# Patient Record
Sex: Male | Born: 2000 | Race: Black or African American | Hispanic: No | Marital: Single | Smoking: Former smoker
Health system: Southern US, Community
[De-identification: ages and names within clinical notes are randomized; demographics above are authoritative.]

## PROBLEM LIST (undated history)

## (undated) DIAGNOSIS — J309 Allergic rhinitis, unspecified: Secondary | ICD-10-CM

## (undated) DIAGNOSIS — R51 Headache: Secondary | ICD-10-CM

## (undated) DIAGNOSIS — J4540 Moderate persistent asthma, uncomplicated: Secondary | ICD-10-CM

## (undated) DIAGNOSIS — J45909 Unspecified asthma, uncomplicated: Secondary | ICD-10-CM

## (undated) DIAGNOSIS — R519 Headache, unspecified: Secondary | ICD-10-CM

## (undated) HISTORY — DX: Headache: R51

## (undated) HISTORY — DX: Allergic rhinitis, unspecified: J30.9

## (undated) HISTORY — DX: Headache, unspecified: R51.9

## (undated) HISTORY — DX: Unspecified asthma, uncomplicated: J45.909

## (undated) HISTORY — PX: OTHER SURGICAL HISTORY: SHX169

## (undated) HISTORY — DX: Moderate persistent asthma, uncomplicated: J45.40

---
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
## 2022-03-15 ENCOUNTER — Other Ambulatory Visit: Payer: Self-pay | Admitting: *Deleted

## 2022-03-15 MED ORDER — ALBUTEROL SULFATE HFA 108 (90 BASE) MCG/ACT IN AERS
1.0000 | INHALATION_SPRAY | Freq: Four times a day (QID) | RESPIRATORY_TRACT | 11 refills | Status: DC | PRN
Start: 2022-03-15 — End: 2022-07-04

## 2022-03-18 ENCOUNTER — Other Ambulatory Visit: Payer: Self-pay | Admitting: *Deleted

## 2022-03-18 MED ORDER — VENTOLIN HFA 108 (90 BASE) MCG/ACT IN AERS
1.0000 | INHALATION_SPRAY | Freq: Four times a day (QID) | RESPIRATORY_TRACT | 3 refills | Status: DC | PRN
Start: 2022-03-18 — End: 2022-07-04

## 2022-04-12 ENCOUNTER — Telehealth: Payer: Self-pay | Admitting: *Deleted

## 2022-04-12 NOTE — Telephone Encounter (Signed)
PA for Albuterol Sulfate HFA 108 (90 Base)MCG/ACT aerosol    Approved, Coverage Starts on: 04/11/2022 12:00:00 AM, Coverage Ends on: 04/11/2023 12:00:00 AM.   Pharmacy aware.

## 2022-06-12 ENCOUNTER — Telehealth: Payer: Medicaid Other | Admitting: Nurse Practitioner

## 2022-06-12 ENCOUNTER — Encounter: Payer: Self-pay | Admitting: Nurse Practitioner

## 2022-06-12 DIAGNOSIS — Z91199 Patient's noncompliance with other medical treatment and regimen due to unspecified reason: Secondary | ICD-10-CM

## 2022-06-12 NOTE — Progress Notes (Signed)
I called patient and he says he was already seen at the urgent care earlier and will not need this visit.

## 2022-06-20 ENCOUNTER — Ambulatory Visit: Payer: Medicaid Other | Admitting: Allergy

## 2022-07-01 ENCOUNTER — Ambulatory Visit: Payer: Medicaid Other | Admitting: Internal Medicine

## 2022-07-03 ENCOUNTER — Ambulatory Visit: Payer: Medicaid Other | Admitting: Internal Medicine

## 2022-07-03 NOTE — Progress Notes (Unsigned)
New Patient Note  RE: James Pittman MRN: [id number] DOB: 01-13-2001 Date of Office Visit: 07/04/2022  Consult requested by: Rogelia Mire, PA Primary care provider: Loman Brooklyn, FNP  Chief Complaint: No chief complaint on file.  History of Present Illness: I had the pleasure of seeing James Pittman for initial evaluation at the [facility] [place] [facility] [place] on 07/03/2022. He is a 22 y.o. male, who is referred here by Loman Brooklyn, FNP for the evaluation of asthma.  He reports symptoms of *** chest tightness, shortness of breath, coughing, wheezing, nocturnal awakenings for *** years. Current medications include *** which help. He reports *** using aerochamber with inhalers. He tried the following inhalers: ***. Main triggers are ***allergies, infections, weather changes, smoke, exercise, pet exposure. In the last month, frequency of symptoms: ***x/week. Frequency of nocturnal symptoms: ***x/month. Frequency of SABA use: ***x/week. Interference with physical activity: ***. Sleep is ***disturbed. In the last 12 months, emergency room visits/urgent care visits/doctor office visits or hospitalizations due to respiratory issues: ***. In the last 12 months, oral steroids courses: ***. Lifetime history of hospitalization for respiratory issues: ***. Prior intubations: ***. Asthma was diagnosed at age *** by ***. History of pneumonia: ***. He was evaluated by allergist ***pulmonologist in the past. Smoking exposure: ***. Up to date with flu vaccine: ***. Up to date with pneumonia vaccine: ***. Up to date with COVID-19 vaccine: ***. Prior Covid-19 infection: ***. History of reflux: ***.  Assessment and Plan: James Pittman is a 22 y.o. male with: No problem-specific Assessment & Plan notes found for this encounter.  No follow-ups on file.  No orders of the defined types were placed in this encounter.  Lab Orders  No laboratory test(s) ordered today    Other allergy screening: Asthma: {Blank  single:19197::"yes","no"} Rhino conjunctivitis: {Blank single:19197::"yes","no"} Food allergy: {Blank single:19197::"yes","no"} Medication allergy: {Blank single:19197::"yes","no"} Hymenoptera allergy: {Blank single:19197::"yes","no"} Urticaria: {Blank single:19197::"yes","no"} Eczema:{Blank single:19197::"yes","no"} History of recurrent infections suggestive of immunodeficency: {Blank single:19197::"yes","no"}  Diagnostics: Spirometry:  Tracings reviewed. His effort: {Blank single:19197::"Good reproducible efforts.","It was hard to get consistent efforts and there is a question as to whether this reflects a maximal maneuver.","Poor effort, data can not be interpreted."} FVC: ***L FEV1: ***L, ***% predicted FEV1/FVC ratio: ***% Interpretation: {Blank single:19197::"Spirometry consistent with mild obstructive disease","Spirometry consistent with moderate obstructive disease","Spirometry consistent with severe obstructive disease","Spirometry consistent with possible restrictive disease","Spirometry consistent with mixed obstructive and restrictive disease","Spirometry uninterpretable due to technique","Spirometry consistent with normal pattern","No overt abnormalities noted given today's efforts"}.  Please see scanned spirometry results for details.  Skin Testing: {Blank single:19197::"Select foods","Environmental allergy panel","Environmental allergy panel and select foods","Food allergy panel","None","Deferred due to recent antihistamines use"}. *** Results discussed with patient/family.   Past Medical History: Patient Active Problem List   Diagnosis Date Noted  . Upper respiratory infection with cough and congestion 07/17/2021  . Other headache syndrome 01/08/2021  . Asthma 03/14/2015   Past Medical History:  Diagnosis Date  . Asthma   . Headache    Past Surgical History: Past Surgical History:  Procedure Laterality Date  . broke finger Left    index   Medication List:   Current Outpatient Medications  Medication Sig Dispense Refill  . albuterol (VENTOLIN HFA) 108 (90 Base) MCG/ACT inhaler Inhale 1-2 puffs into the lungs every 6 (six) hours as needed for wheezing or shortness of breath. 8 g 11  . amoxicillin-clavulanate (AUGMENTIN) 875-125 MG tablet Take 1 tablet by mouth 2 (two) times daily. 20 tablet 0  .  fluticasone (FLOVENT HFA) 44 MCG/ACT inhaler Inhale 2 puffs into the lungs in the morning and at bedtime. (Patient not taking: Reported on 12/25/2021) 3 each 3  . montelukast (SINGULAIR) 10 MG tablet Take 10 mg by mouth daily. (Patient not taking: Reported on 12/25/2021)    . predniSONE (DELTASONE) 20 MG tablet 2 tabs po daily x 4 days 8 tablet 0  . VENTOLIN HFA 108 (90 Base) MCG/ACT inhaler Inhale 1-2 puffs into the lungs every 6 (six) hours as needed for wheezing or shortness of breath. 8 g 3   No current facility-administered medications for this visit.   Allergies: No Known Allergies Social History: Social History   Socioeconomic History  . Marital status: Single    Spouse name: Not on file  . Number of children: Not on file  . Years of education: Not on file  . Highest education level: Not on file  Occupational History  . Not on file  Tobacco Use  . Smoking status: Never  . Smokeless tobacco: Never  Vaping Use  . Vaping Use: Some days  Substance and Sexual Activity  . Alcohol use: No  . Drug use: No  . Sexual activity: Not on file  Other Topics Concern  . Not on file  Social History Narrative  . Not on file   Social Determinants of Health   Financial Resource Strain: Not on file  Food Insecurity: Not on file  Transportation Needs: Not on file  Physical Activity: Not on file  Stress: Not on file  Social Connections: Not on file   Lives in a ***. Smoking: *** Occupation: ***  Environmental HistoryFreight forwarder in the house: Estate agent in the family room: {Blank  single:19197::"yes","no"} Carpet in the bedroom: {Blank single:19197::"yes","no"} Heating: {Blank single:19197::"electric","gas","heat pump"} Cooling: {Blank single:19197::"central","window","heat pump"} Pet: {Blank single:19197::"yes ***","no"}  Family History: Family History  Problem Relation Age of Onset  . Hypertension Mother    Problem                               Relation Asthma                                   *** Eczema                                *** Food allergy                          *** Allergic rhino conjunctivitis     ***  Review of Systems  Constitutional:  Negative for appetite change, chills, fever and unexpected weight change.  HENT:  Negative for congestion and rhinorrhea.   Eyes:  Negative for itching.  Respiratory:  Negative for cough, chest tightness, shortness of breath and wheezing.   Cardiovascular:  Negative for chest pain.  Gastrointestinal:  Negative for abdominal pain.  Genitourinary:  Negative for difficulty urinating.  Skin:  Negative for rash.  Neurological:  Negative for headaches.   Objective: There were no vitals taken for this visit. There is no height or weight on file to calculate BMI. Physical Exam Vitals and nursing note reviewed.  Constitutional:      Appearance: Normal appearance. He is well-developed.  HENT:     Head: Normocephalic and atraumatic.  Right Ear: Tympanic membrane and external ear normal.     Left Ear: Tympanic membrane and external ear normal.     Nose: Nose normal.     Mouth/Throat:     Mouth: Mucous membranes are moist.     Pharynx: Oropharynx is clear.  Eyes:     Conjunctiva/sclera: Conjunctivae normal.  Cardiovascular:     Rate and Rhythm: Normal rate and regular rhythm.     Heart sounds: Normal heart sounds. No murmur heard.    No friction rub. No gallop.  Pulmonary:     Effort: Pulmonary effort is normal.     Breath sounds: Normal breath sounds. No wheezing, rhonchi or rales.  Musculoskeletal:      Cervical back: Neck supple.  Skin:    General: Skin is warm.     Findings: No rash.  Neurological:     Mental Status: He is alert and oriented to person, place, and time.  Psychiatric:        Behavior: Behavior normal.  The plan was reviewed with the patient/family, and all questions/concerned were addressed.  It was my pleasure to see James Pittman today and participate in his care. Please feel free to contact me with any questions or concerns.  Sincerely,  Wyline Mood, DO Allergy & Immunology  [facility] office: [phone] [facility] office: [phone]

## 2022-07-04 ENCOUNTER — Other Ambulatory Visit: Payer: Self-pay

## 2022-07-04 ENCOUNTER — Ambulatory Visit (INDEPENDENT_AMBULATORY_CARE_PROVIDER_SITE_OTHER): Payer: Medicaid Other | Admitting: Allergy

## 2022-07-04 ENCOUNTER — Encounter: Payer: Self-pay | Admitting: Allergy

## 2022-07-04 VITALS — BP 114/76 | HR 80 | Temp 98.3°F | Resp 16 | Ht 63.03 in | Wt 134.4 lb

## 2022-07-04 DIAGNOSIS — J4541 Moderate persistent asthma with (acute) exacerbation: Secondary | ICD-10-CM

## 2022-07-04 DIAGNOSIS — J3089 Other allergic rhinitis: Secondary | ICD-10-CM

## 2022-07-04 MED ORDER — PREDNISONE 20 MG PO TABS: 20.0000 mg | ORAL_TABLET | Freq: Every day | ORAL | 0 refills | Status: DC

## 2022-07-04 MED ORDER — BUDESONIDE-FORMOTEROL FUMARATE 160-4.5 MCG/ACT IN AERO: 2.0000 | INHALATION_SPRAY | Freq: Two times a day (BID) | RESPIRATORY_TRACT | 3 refills | Status: DC

## 2022-07-04 NOTE — Assessment & Plan Note (Signed)
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  No skin testing today due poor asthma control status. Will test at next visit if asthma is better controlled otherwise will get bloodwork.

## 2022-07-04 NOTE — Assessment & Plan Note (Signed)
>>  ASSESSMENT AND PLAN FOR ASTHMA, NOT WELL CONTROLLED WRITTEN ON 07/04/2022 10:35 AM BY Garnet Sierras, DO  Diagnosed with asthma over 10 years ago and noted worsening symptom the past 5 years. Currently only using albuterol on a daily basis. He is not sure if he had a daily maintenance inhaler in the past. No triggers noted. Covid-19 in 2020. Trying to quit vaping. 2023 CXR unremarkable.  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 Clinically feeling slightly improved.  Take prednisone 38m once a day for 5 days.  Daily controller medication(s): start Symbicort 1650m 2 puffs twice a day with spacer and rinse mouth afterwards. Must take everyday!!! Spacer given and demonstrated proper use with inhaler. Patient understood technique and all questions/concerned were addressed.  May use albuterol rescue inhaler 2 puffs every 4 to 6 hours as needed for shortness of breath, chest tightness, coughing, and wheezing. May use albuterol rescue inhaler 2 puffs 5 to 15 minutes prior to strenuous physical activities. Monitor frequency of use.  Get spirometry at next visit. If no improvement will discuss adding on biologics next. Quit vaping.

## 2022-07-04 NOTE — Patient Instructions (Addendum)
Asthma:  Take prednisone 20mg  once a day for 5 days.  Daily controller medication(s): start Symbicort 155mcg 2 puffs twice a day with spacer and rinse mouth afterwards. Must take everyday!!! Spacer given and demonstrated proper use with inhaler. Patient understood technique and all questions/concerned were addressed.  May use albuterol rescue inhaler 2 puffs every 4 to 6 hours as needed for shortness of breath, chest tightness, coughing, and wheezing. May use albuterol rescue inhaler 2 puffs 5 to 15 minutes prior to strenuous physical activities. Monitor frequency of use.  Breathing control goals:  Full participation in all desired activities (may need albuterol before activity) Albuterol use two times or less a week on average (not counting use with activity) Cough interfering with sleep two times or less a month Oral steroids no more than once a year No hospitalizations   Follow up in 1 months or sooner if needed.  We will do skin testing when your breathing is doing better.  Quit vaping.  Recommend establishing care with a PCP.

## 2022-07-04 NOTE — Assessment & Plan Note (Signed)
Cats flare rhino conjunctivitis symptoms and asthma.  No skin testing today due poor asthma control status. Will test at next visit if asthma is better controlled otherwise will get bloodwork.

## 2022-07-04 NOTE — Assessment & Plan Note (Signed)
Diagnosed with asthma over 10 years ago and noted worsening symptom the past 5 years. Currently only using albuterol on a daily basis. He is not sure if he had a daily maintenance inhaler in the past. No triggers noted. Covid-19 in 2020. Trying to quit vaping. 2023 CXR unremarkable.  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 Clinically feeling slightly improved.  Take prednisone 20mg  once a day for 5 days.  Daily controller medication(s): start Symbicort 167mcg 2 puffs twice a day with spacer and rinse mouth afterwards. Must take everyday!!! Spacer given and demonstrated proper use with inhaler. Patient understood technique and all questions/concerned were addressed.  May use albuterol rescue inhaler 2 puffs every 4 to 6 hours as needed for shortness of breath, chest tightness, coughing, and wheezing. May use albuterol rescue inhaler 2 puffs 5 to 15 minutes prior to strenuous physical activities. Monitor frequency of use.  Get spirometry at next visit. If no improvement will discuss adding on biologics next. Quit vaping.

## 2022-07-15 NOTE — Progress Notes (Deleted)
Follow Up Note  RE: James Pittman MRN: [id number] DOB: 02-07-2001 Date of Office Visit: 07/16/2022  Referring provider: Loman Brooklyn, FNP Primary care provider: Loman Brooklyn, FNP  Chief Complaint: No chief complaint on file.  History of Present Illness: I had the pleasure of seeing James Pittman for a follow up visit at the [facility] on 07/15/2022. He is a 22 y.o. male, who is being followed for asthma and allergic rhinitis. His previous allergy office visit was on 07/04/2022 with Dr. Maudie Mercury. Today is a skin testing and follow up visit.  Asthma, not well controlled Diagnosed with asthma over 10 years ago and noted worsening symptom the past 5 years. Currently only using albuterol on a daily basis. He is not sure if he had a daily maintenance inhaler in the past. No triggers noted. Covid-19 in 2020. Trying to quit vaping. 2023 CXR unremarkable.  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 Clinically feeling slightly improved.  Take prednisone '20mg'$  once a day for 5 days.  Daily controller medication(s): start Symbicort 122mg 2 puffs twice a day with spacer and rinse mouth afterwards. Must take everyday!!! Spacer given and demonstrated proper use with inhaler. Patient understood technique and all questions/concerned were addressed.  May use albuterol rescue inhaler 2 puffs every 4 to 6 hours as needed for shortness of breath, chest tightness, coughing, and wheezing. May use albuterol rescue inhaler 2 puffs 5 to 15 minutes prior to strenuous physical activities. Monitor frequency of use.  Get spirometry at next visit. If no improvement will discuss adding on biologics next. Quit vaping.   Other allergic rhinitis Cats flare rhino conjunctivitis symptoms and asthma.  No skin testing today due poor asthma control status. Will test at next visit if asthma is better controlled otherwise  will get bloodwork.    Return in about 4 weeks (around 08/01/2022) for Skin testing.  Assessment and Plan: James Pittman is a 22y.o. male with: No problem-specific Assessment & Plan notes found for this encounter.  No follow-ups on file.  No orders of the defined types were placed in this encounter.  Lab Orders  No laboratory test(s) ordered today    Diagnostics: Spirometry:  Tracings reviewed. His effort: {Blank single:19197::"Good reproducible efforts.","It was hard to get consistent efforts and there is a question as to whether this reflects a maximal maneuver.","Poor effort, data can not be interpreted."} FVC: ***L FEV1: ***L, ***% predicted FEV1/FVC ratio: ***% Interpretation: {Blank single:19197::"Spirometry consistent with mild obstructive disease","Spirometry consistent with moderate obstructive disease","Spirometry consistent with severe obstructive disease","Spirometry consistent with possible restrictive disease","Spirometry consistent with mixed obstructive and restrictive disease","Spirometry uninterpretable due to technique","Spirometry consistent with normal pattern","No overt abnormalities noted given today's efforts"}.  Please see scanned spirometry results for details.  Skin Testing: {Blank single:19197::"Select foods","Environmental allergy panel","Environmental allergy panel and select foods","Food allergy panel","None","Deferred due to recent antihistamines use"}. *** Results discussed with patient/family.   Medication List:  Current Outpatient Medications  Medication Sig Dispense Refill   budesonide-formoterol (SYMBICORT) 160-4.5 MCG/ACT inhaler Inhale 2 puffs into the lungs in the morning and at bedtime. 1 each 3   predniSONE (DELTASONE) 20 MG tablet Take 1 tablet (20 mg total) by mouth daily with breakfast. 5 tablet 0   No current facility-administered medications for this visit.   Allergies: No Known Allergies I reviewed his past medical history, social  history, family history, and environmental history and no significant changes have been  reported from his previous visit.  Review of Systems  Constitutional:  Negative for appetite change, chills, fever and unexpected weight change.  HENT:  Negative for congestion and rhinorrhea.   Eyes:  Negative for itching.  Respiratory:  Positive for cough, chest tightness, shortness of breath and wheezing.   Cardiovascular:  Negative for chest pain.  Gastrointestinal:  Negative for abdominal pain.  Genitourinary:  Negative for difficulty urinating.  Skin:  Negative for rash.  Neurological:  Negative for headaches.    Objective: There were no vitals taken for this visit. There is no height or weight on file to calculate BMI. Physical Exam Vitals and nursing note reviewed.  Constitutional:      Appearance: Normal appearance. He is well-developed.  HENT:     Head: Normocephalic and atraumatic.     Right Ear: Tympanic membrane and external ear normal.     Left Ear: Tympanic membrane and external ear normal.     Nose: Nose normal.     Mouth/Throat:     Mouth: Mucous membranes are moist.     Pharynx: Oropharynx is clear.  Eyes:     Conjunctiva/sclera: Conjunctivae normal.  Cardiovascular:     Rate and Rhythm: Normal rate and regular rhythm.     Heart sounds: Normal heart sounds. No murmur heard.    No friction rub. No gallop.  Pulmonary:     Effort: Pulmonary effort is normal.     Breath sounds: No wheezing, rhonchi or rales.     Comments: Decreased breath sounds throughout - improved after bronchodilator treatment.  Musculoskeletal:     Cervical back: Neck supple.  Skin:    General: Skin is warm.     Findings: No rash.  Neurological:     Mental Status: He is alert and oriented to person, place, and time.  Psychiatric:        Behavior: Behavior normal.    Previous notes and tests were reviewed. The plan was reviewed with the patient/family, and all questions/concerned were  addressed.  It was my pleasure to see James Pittman today and participate in his care. Please feel free to contact me with any questions or concerns.  Sincerely,  Rexene Alberts, DO Allergy & Immunology  [facility] office: [place] office: [phone]

## 2022-07-16 ENCOUNTER — Ambulatory Visit: Payer: Medicaid Other | Admitting: Allergy

## 2022-07-16 DIAGNOSIS — J3089 Other allergic rhinitis: Secondary | ICD-10-CM

## 2022-07-31 NOTE — Progress Notes (Unsigned)
Follow Up Note  RE: James Pittman MRN: [id number] DOB: 05/25/2001 Date of Office Visit: 08/01/2022  Referring provider: Loman Brooklyn, FNP Primary care provider: No primary care provider on file.  Chief Complaint: Allergy Testing  History of Present Illness: I had the pleasure of seeing James Pittman for a follow up visit at the [facility] on 08/01/2022. He is a 22 y.o. male, who is being followed for asthma, allergic rhinitis. His previous allergy office visit was on 07/04/2022 with Dr. Maudie Mercury. Today is a skin testing and follow up visit.  Asthma Patient has been doing very well with the Symbicort 175mg 2 puffs twice a day. He has not needed to use his rescue inhaler for the past 3 weeks. He also quit vaping.   Other allergic rhinitis Here for allergy testing.  Has PCP appointment this week.  Assessment and Plan: James Pittman is a 22y.o. male with: Moderate persistent asthma without complication Past history - diagnosed with asthma over 10 years ago and noted worsening symptom the past 5 years. Currently only using albuterol on a daily basis. He is not sure if he had a daily maintenance inhaler in the past. No triggers noted. Covid-19 in 2020. 2023 CXR unremarkable. 2024 spirometry showed: mixed obstructive and restrictive disease with 22% improvement in FEV1 post bronchodilator treatment. Clinically feeling slightly improved.  Interim history - doing much better with daily Symbicort and no albuterol use the past 3 weeks. Quit vaping.  Today's spirometry was normal. Daily controller medication(s): continue Symbicort 1619m 2 puffs twice a day with spacer and rinse mouth afterwards. Must take everyday!!! May use albuterol rescue inhaler 2 puffs every 4 to 6 hours as needed for shortness of breath, chest tightness, coughing, and wheezing. May use albuterol rescue inhaler 2 puffs 5 to 15 minutes prior to strenuous physical activities. Monitor frequency of use.  Get  spirometry at next visit.  Other allergic rhinitis Past history - Cats flare rhino conjunctivitis symptoms and asthma.  Today's skin prick testing showed: Positive to ragweed, weed, trees, dust mites, cat and cockroach.  Declined intradermal testing.  Start environmental control measures as below. Take Zyrtec (cetirizine) 1034maily at night. May take twice a day during allergy flares. May switch antihistamines every few months. Use cromolyn 4% 1 drop in each eye up to four times a day as needed for itchy/watery eyes.  Consider allergy injections for long term control if above medications do not help the symptoms - handout given.  Will need to get bloodwork first.  Allergic conjunctivitis of both eyes See assessment and plan as above.  Rash and other nonspecific skin eruption Rash on the back. No triggers noted.  Keep track of rash breakouts and take pictures if it happens again. May need to get bloodwork if you keep breaking out.  The zyrtec 76m33mily should help with this.  Return in about 2 months (around 09/30/2022).  Meds ordered this encounter  Medications   cetirizine (ZYRTEC ALLERGY) 10 MG tablet    Sig: Take 1 tablet (10 mg total) by mouth at bedtime.    Dispense:  30 tablet    Refill:  3   cromolyn (OPTICROM) 4 % ophthalmic solution    Sig: Place 1 drop into both eyes 4 (four) times daily as needed (itchy/watery eyes).    Dispense:  10 mL    Refill:  3   albuterol (VENTOLIN HFA) 108 (90 Base) MCG/ACT inhaler    Sig: Inhale  2 puffs into the lungs every 4 (four) hours as needed for wheezing or shortness of breath (coughing fits).    Dispense:  18 g    Refill:  1   Lab Orders  No laboratory test(s) ordered today    Diagnostics: Spirometry:  Tracings reviewed. His effort: Good reproducible efforts. FVC: 3.58L FEV1: 3.12L, 84% predicted FEV1/FVC ratio: 87% Interpretation: Spirometry consistent with normal pattern.  Please see scanned spirometry results for  details.  Skin Testing: Environmental allergy panel. Positive to ragweed, weed, trees, dust mites, cat and cockroach.  Declined intradermal testing.  Results discussed with patient/family.  Airborne Adult Perc - 08/01/22 0942     Time Antigen Placed [id number]    Allergen Manufacturer Lavella Hammock    Location Back    Number of Test 59    1. Control-Buffer 50% Glycerol Negative    2. Control-Histamine 1 mg/ml 3+    3. Albumin saline Negative    4. [place] Negative    5. [place] Negative    6. Johnson Negative    7. [place] Blue Negative    8. Meadow Fescue Negative    9. Perennial Rye Negative    10. Sweet Vernal Negative    11. Timothy Negative    12. Cocklebur Negative    13. Burweed Marshelder Negative    14. Ragweed, short 2+    15. Ragweed, Giant Negative    16. Plantain,  English Negative    17. Lamb's Quarters Negative    18. Sheep Sorrell 2+    19. Rough Pigweed Negative    20. Marsh Elder, Rough 2+    21. Mugwort, Common Negative    22. Ash mix Negative    23. Birch mix Negative    24. Beech American Negative    25. Box, Elder Negative    26. Cedar, red Negative    27. Cottonwood, [place] Negative    28. Elm mix Negative    29. Hickory 4+    30. Maple mix --   +/-   31. Oak, [place] mix Negative    32. Pecan Pollen 3+    33. Pine mix Negative    34. Sycamore Eastern Negative    35. [place], Black Pollen Negative    36. Alternaria alternata Negative    37. Cladosporium Herbarum Negative    38. Aspergillus mix Negative    39. Penicillium mix Negative    40. Bipolaris sorokiniana (Helminthosporium) Negative    41. Drechslera spicifera (Curvularia) Negative    42. Mucor plumbeus Negative    43. Fusarium moniliforme Negative    44. Aureobasidium pullulans (pullulara) Negative    45. Rhizopus oryzae Negative    46. Botrytis cinera Negative    47. Epicoccum nigrum Negative    48. Phoma betae Negative    49. Candida Albicans Negative    50. Trichophyton mentagrophytes  Negative    51. Mite, D Farinae  5,000 AU/ml 2+    52. Mite, D Pteronyssinus  5,000 AU/ml --   +/-   53. Cat Hair 10,000 BAU/ml 3+    54.  Dog Epithelia Negative    55. Mixed Feathers Negative    56. Horse Epithelia Negative    57. Cockroach, German 3+    58. Mouse Negative    59. Tobacco Leaf Negative             Intradermal - 08/01/22 1019     Time Antigen Placed --    Allergen Manufacturer --  Location --    Number of Test --    Control Omitted    [place] Omitted    Johnson Omitted    7 Grass Omitted    Mold 1 Omitted    Mold 2 Omitted    Mold 3 Omitted    Mold 4 Omitted    Dog Omitted             Medication List:  Current Outpatient Medications  Medication Sig Dispense Refill   albuterol (VENTOLIN HFA) 108 (90 Base) MCG/ACT inhaler Inhale 2 puffs into the lungs every 4 (four) hours as needed for wheezing or shortness of breath (coughing fits). 18 g 1   budesonide-formoterol (SYMBICORT) 160-4.5 MCG/ACT inhaler Inhale 2 puffs into the lungs in the morning and at bedtime. 1 each 3   cetirizine (ZYRTEC ALLERGY) 10 MG tablet Take 1 tablet (10 mg total) by mouth at bedtime. 30 tablet 3   cromolyn (OPTICROM) 4 % ophthalmic solution Place 1 drop into both eyes 4 (four) times daily as needed (itchy/watery eyes). 10 mL 3   No current facility-administered medications for this visit.   Allergies: No Known Allergies I reviewed his past medical history, social history, family history, and environmental history and no significant changes have been reported from his previous visit.  Review of Systems  Constitutional:  Negative for appetite change, chills, fever and unexpected weight change.  HENT:  Negative for congestion and rhinorrhea.   Eyes:  Positive for itching.  Respiratory:  Negative for cough, chest tightness, shortness of breath and wheezing.   Cardiovascular:  Negative for chest pain.  Gastrointestinal:  Negative for abdominal pain.  Genitourinary:   Negative for difficulty urinating.  Skin:  Positive for rash.  Allergic/Immunologic: Positive for environmental allergies.  Neurological:  Negative for headaches.    Objective: BP 120/76   Pulse (!) 54   Temp 98.3 F (36.8 C)   Resp 16   SpO2 97%  There is no height or weight on file to calculate BMI. Physical Exam Vitals and nursing note reviewed.  Constitutional:      Appearance: Normal appearance. He is well-developed.  HENT:     Head: Normocephalic and atraumatic.     Right Ear: Tympanic membrane and external ear normal.     Left Ear: Tympanic membrane and external ear normal.     Nose: Nose normal.     Mouth/Throat:     Mouth: Mucous membranes are moist.     Pharynx: Oropharynx is clear.  Eyes:     Conjunctiva/sclera: Conjunctivae normal.  Cardiovascular:     Rate and Rhythm: Normal rate and regular rhythm.     Heart sounds: Normal heart sounds. No murmur heard. Pulmonary:     Effort: Pulmonary effort is normal.     Breath sounds: Normal breath sounds. No wheezing, rhonchi or rales.  Musculoskeletal:     Cervical back: Neck supple.  Skin:    General: Skin is warm.     Findings: No rash.  Neurological:     Mental Status: He is alert and oriented to person, place, and time.  Psychiatric:        Behavior: Behavior normal.    Previous notes and tests were reviewed. The plan was reviewed with the patient/family, and all questions/concerned were addressed.  It was my pleasure to see James Pittman today and participate in his care. Please feel free to contact me with any questions or concerns.  Sincerely,  Rexene Alberts, DO Allergy & Immunology  [facility] office: [place] office: [phone]

## 2022-08-01 ENCOUNTER — Other Ambulatory Visit: Payer: Self-pay

## 2022-08-01 ENCOUNTER — Encounter: Payer: Self-pay | Admitting: Allergy

## 2022-08-01 ENCOUNTER — Ambulatory Visit (INDEPENDENT_AMBULATORY_CARE_PROVIDER_SITE_OTHER): Payer: Medicaid Other | Admitting: Allergy

## 2022-08-01 ENCOUNTER — Ambulatory Visit: Payer: Medicaid Other | Admitting: Allergy

## 2022-08-01 VITALS — BP 120/76 | HR 54 | Temp 98.3°F | Resp 16

## 2022-08-01 DIAGNOSIS — H1013 Acute atopic conjunctivitis, bilateral: Secondary | ICD-10-CM | POA: Diagnosis not present

## 2022-08-01 DIAGNOSIS — J4540 Moderate persistent asthma, uncomplicated: Secondary | ICD-10-CM

## 2022-08-01 DIAGNOSIS — R21 Rash and other nonspecific skin eruption: Secondary | ICD-10-CM

## 2022-08-01 DIAGNOSIS — J3089 Other allergic rhinitis: Secondary | ICD-10-CM | POA: Diagnosis not present

## 2022-08-01 MED ORDER — ALBUTEROL SULFATE HFA 108 (90 BASE) MCG/ACT IN AERS: 2.0000 | INHALATION_SPRAY | RESPIRATORY_TRACT | 1 refills | Status: DC | PRN

## 2022-08-01 MED ORDER — CROMOLYN SODIUM 4 % OP SOLN: 1.0000 [drp] | Freq: Four times a day (QID) | OPHTHALMIC | 3 refills | Status: DC | PRN

## 2022-08-01 MED ORDER — CETIRIZINE HCL 10 MG PO TABS: 10.0000 mg | ORAL_TABLET | Freq: Every day | ORAL | 3 refills | Status: AC

## 2022-08-01 NOTE — Assessment & Plan Note (Signed)
Past history - Cats flare rhino conjunctivitis symptoms and asthma.  Today's skin prick testing showed: Positive to ragweed, weed, trees, dust mites, cat and cockroach.  Declined intradermal testing.  Start environmental control measures as below. Take Zyrtec (cetirizine) 48m daily at night. May take twice a day during allergy flares. May switch antihistamines every few months. Use cromolyn 4% 1 drop in each eye up to four times a day as needed for itchy/watery eyes.  Consider allergy injections for long term control if above medications do not help the symptoms - handout given.  Will need to get bloodwork first.

## 2022-08-01 NOTE — Assessment & Plan Note (Signed)
Rash on the back. No triggers noted.  Keep track of rash breakouts and take pictures if it happens again. May need to get bloodwork if you keep breaking out.  The zyrtec 13m daily should help with this.

## 2022-08-01 NOTE — Assessment & Plan Note (Signed)
.   See assessment and plan as above. 

## 2022-08-01 NOTE — Assessment & Plan Note (Signed)
>>  ASSESSMENT AND PLAN FOR OTHER ALLERGIC RHINITIS WRITTEN ON 08/01/2022  1:11 PM BY Ellamae Sia, DO  Past history - Cats flare rhino conjunctivitis symptoms and asthma.  Today's skin prick testing showed: Positive to ragweed, weed, trees, dust mites, cat and cockroach.  Declined intradermal testing.  Start environmental control measures as below. Take Zyrtec (cetirizine) 10mg  daily at night. May take twice a day during allergy flares. May switch antihistamines every few months. Use cromolyn 4% 1 drop in each eye up to four times a day as needed for itchy/watery eyes.  Consider allergy injections for long term control if above medications do not help the symptoms - handout given.  Will need to get bloodwork first.

## 2022-08-01 NOTE — Assessment & Plan Note (Signed)
Past history - diagnosed with asthma over 10 years ago and noted worsening symptom the past 5 years. Currently only using albuterol on a daily basis. He is not sure if he had a daily maintenance inhaler in the past. No triggers noted. Covid-19 in 2020. 2023 CXR unremarkable. 2024 spirometry showed: mixed obstructive and restrictive disease with 22% improvement in FEV1 post bronchodilator treatment. Clinically feeling slightly improved.  Interim history - doing much better with daily Symbicort and no albuterol use the past 3 weeks. Quit vaping.  Today's spirometry was normal. Daily controller medication(s): continue Symbicort 128mg 2 puffs twice a day with spacer and rinse mouth afterwards. Must take everyday!!! May use albuterol rescue inhaler 2 puffs every 4 to 6 hours as needed for shortness of breath, chest tightness, coughing, and wheezing. May use albuterol rescue inhaler 2 puffs 5 to 15 minutes prior to strenuous physical activities. Monitor frequency of use.  Get spirometry at next visit.

## 2022-08-01 NOTE — Patient Instructions (Addendum)
Today's skin testing showed: Positive to ragweed, weed, trees, dust mites, cat and cockroach.   Results given.  Environmental allergies Start environmental control measures as below. Take Zyrtec (cetirizine) 3m daily at night. May take twice a day during allergy flares. May switch antihistamines every few months. Use cromolyn 4% 1 drop in each eye up to four times a day as needed for itchy/watery eyes.  Consider allergy injections for long term control if above medications do not help the symptoms - handout given.  Will need to get bloodwork first.  Rash Keep track of rash breakouts and take pictures if it happens again. May need to get bloodwork if you keep breaking out.  The zyrtec 163mdaily should help with this.   Asthma:  Daily controller medication(s): continue Symbicort 16031m2 puffs twice a day with spacer and rinse mouth afterwards. Must take everyday!!! May use albuterol rescue inhaler 2 puffs every 4 to 6 hours as needed for shortness of breath, chest tightness, coughing, and wheezing. May use albuterol rescue inhaler 2 puffs 5 to 15 minutes prior to strenuous physical activities. Monitor frequency of use.  Breathing control goals:  Full participation in all desired activities (may need albuterol before activity) Albuterol use two times or less a week on average (not counting use with activity) Cough interfering with sleep two times or less a month Oral steroids no more than once a year No hospitalizations   Follow up in 2 months or sooner if needed.   Reducing Pollen Exposure Pollen seasons: trees (spring), grass (summer) and ragweed/weeds (fall). Keep windows closed in your home and car to lower pollen exposure.  Install air conditioning in the bedroom and throughout the house if possible.  Avoid going out in dry windy days - especially early morning. Pollen counts are highest between 5 - 10 AM and on dry, hot and windy days.  Save outside activities for late  afternoon or after a heavy rain, when pollen levels are lower.  Avoid mowing of grass if you have grass pollen allergy. Be aware that pollen can also be transported indoors on people and pets.  Dry your clothes in an automatic dryer rather than hanging them outside where they might collect pollen.  Rinse hair and eyes before bedtime. Control of House Dust Mite Allergen Dust mite allergens are a common trigger of allergy and asthma symptoms. While they can be found throughout the house, these microscopic creatures thrive in warm, humid environments such as bedding, upholstered furniture and carpeting. Because so much time is spent in the bedroom, it is essential to reduce mite levels there.  Encase pillows, mattresses, and box springs in special allergen-proof fabric covers or airtight, zippered plastic covers.  Bedding should be washed weekly in hot water (130 F) and dried in a hot dryer. Allergen-proof covers are available for comforters and pillows that can't be regularly washed.  Wash the allergy-proof covers every few months. Minimize clutter in the bedroom. Keep pets out of the bedroom.  Keep humidity less than 50% by using a dehumidifier or air conditioning. You can buy a humidity measuring device called a hygrometer to monitor this.  If possible, replace carpets with hardwood, linoleum, or washable area rugs. If that's not possible, vacuum frequently with a vacuum that has a HEPA filter. Remove all upholstered furniture and non-washable window drapes from the bedroom. Remove all non-washable stuffed toys from the bedroom.  Wash stuffed toys weekly. Pet Allergen Avoidance: Contrary to popular opinion, there are no "hypoallergenic"  breeds of dogs or cats. That is because people are not allergic to an animal's hair, but to an allergen found in the animal's saliva, dander (dead skin flakes) or urine. Pet allergy symptoms typically occur within minutes. For some people, symptoms can build up and  become most severe 8 to 12 hours after contact with the animal. People with severe allergies can experience reactions in public places if dander has been transported on the pet owners' clothing. Keeping an animal outdoors is only a partial solution, since homes with pets in the yard still have higher concentrations of animal allergens. Before getting a pet, ask your allergist to determine if you are allergic to animals. If your pet is already considered part of your family, try to minimize contact and keep the pet out of the bedroom and other rooms where you spend a great deal of time. As with dust mites, vacuum carpets often or replace carpet with a hardwood floor, tile or linoleum. High-efficiency particulate air (HEPA) cleaners can reduce allergen levels over time. While dander and saliva are the source of cat and dog allergens, urine is the source of allergens from rabbits, hamsters, mice and [place] pigs; so ask a non-allergic family member to clean the animal's cage. If you have a pet allergy, talk to your allergist about the potential for allergy immunotherapy (allergy shots). This strategy can often provide long-term relief. Cockroach Allergen Avoidance Cockroaches are often found in the homes of densely populated urban areas, schools or commercial buildings, but these creatures can lurk almost anywhere. This does not mean that you have a dirty house or living area. Block all areas where roaches can enter the home. This includes crevices, wall cracks and windows.  Cockroaches need water to survive, so fix and seal all leaky faucets and pipes. Have an exterminator go through the house when your family and pets are gone to eliminate any remaining roaches. Keep food in lidded containers and put pet food dishes away after your pets are done eating. Vacuum and sweep the floor after meals, and take out garbage and recyclables. Use lidded garbage containers in the kitchen. Wash dishes immediately after  use and clean under stoves, refrigerators or toasters where crumbs can accumulate. Wipe off the stove and other kitchen surfaces and cupboards regularly.

## 2022-08-02 ENCOUNTER — Ambulatory Visit (INDEPENDENT_AMBULATORY_CARE_PROVIDER_SITE_OTHER): Payer: Medicaid Other | Admitting: Family Medicine

## 2022-08-02 ENCOUNTER — Encounter: Payer: Self-pay | Admitting: Family Medicine

## 2022-08-02 VITALS — BP 121/72 | HR 60 | Resp 12 | Ht 63.75 in | Wt 136.0 lb

## 2022-08-02 DIAGNOSIS — L700 Acne vulgaris: Secondary | ICD-10-CM

## 2022-08-02 DIAGNOSIS — Z0000 Encounter for general adult medical examination without abnormal findings: Secondary | ICD-10-CM | POA: Diagnosis not present

## 2022-08-02 MED ORDER — CLINDAMYCIN PHOS-BENZOYL PEROX 1-5 % EX GEL: CUTANEOUS | 1 refills | Status: DC

## 2022-08-02 NOTE — Patient Instructions (Signed)
Health Maintenance, Male Adopting a healthy lifestyle and getting preventive care are important in promoting health and wellness. Ask your health care provider about: The right schedule for you to have regular tests and exams. Things you can do on your own to prevent diseases and keep yourself healthy. What should I know about diet, weight, and exercise? Eat a healthy diet  Eat a diet that includes plenty of vegetables, fruits, low-fat dairy products, and lean protein. Do not eat a lot of foods that are high in solid fats, added sugars, or sodium. Maintain a healthy weight Body mass index (BMI) is a measurement that can be used to identify possible weight problems. It estimates body fat based on height and weight. Your health care provider can help determine your BMI and help you achieve or maintain a healthy weight. Get regular exercise Get regular exercise. This is one of the most important things you can do for your health. Most adults should: Exercise for at least 150 minutes each week. The exercise should increase your heart rate and make you sweat (moderate-intensity exercise). Do strengthening exercises at least twice a week. This is in addition to the moderate-intensity exercise. Spend less time sitting. Even light physical activity can be beneficial. Watch cholesterol and blood lipids Have your blood tested for lipids and cholesterol at 20 years of age, then have this test every 5 years. You may need to have your cholesterol levels checked more often if: Your lipid or cholesterol levels are high. You are older than 20 years of age. You are at high risk for heart disease. What should I know about cancer screening? Many types of cancers can be detected early and may often be prevented. Depending on your health history and family history, you may need to have cancer screening at various ages. This may include screening for: Colorectal cancer. Prostate cancer. Skin cancer. Lung  cancer. What should I know about heart disease, diabetes, and high blood pressure? Blood pressure and heart disease High blood pressure causes heart disease and increases the risk of stroke. This is more likely to develop in people who have high blood pressure readings or are overweight. Talk with your health care provider about your target blood pressure readings. Have your blood pressure checked: Every 3-5 years if you are 18-39 years of age. Every year if you are 40 years old or older. If you are between the ages of 65 and 75 and are a current or former smoker, ask your health care provider if you should have a one-time screening for abdominal aortic aneurysm (AAA). Diabetes Have regular diabetes screenings. This checks your fasting blood sugar level. Have the screening done: Once every three years after age 45 if you are at a normal weight and have a low risk for diabetes. More often and at a younger age if you are overweight or have a high risk for diabetes. What should I know about preventing infection? Hepatitis B If you have a higher risk for hepatitis B, you should be screened for this virus. Talk with your health care provider to find out if you are at risk for hepatitis B infection. Hepatitis C Blood testing is recommended for: Everyone born from 1945 through 1965. Anyone with known risk factors for hepatitis C. Sexually transmitted infections (STIs) You should be screened each year for STIs, including gonorrhea and chlamydia, if: You are sexually active and are younger than 20 years of age. You are older than 20 years of age and your   health care provider tells you that you are at risk for this type of infection. Your sexual activity has changed since you were last screened, and you are at increased risk for chlamydia or gonorrhea. Ask your health care provider if you are at risk. Ask your health care provider about whether you are at high risk for HIV. Your health care provider  may recommend a prescription medicine to help prevent HIV infection. If you choose to take medicine to prevent HIV, you should first get tested for HIV. You should then be tested every 3 months for as long as you are taking the medicine. Follow these instructions at home: Alcohol use Do not drink alcohol if your health care provider tells you not to drink. If you drink alcohol: Limit how much you have to 0-2 drinks a day. Know how much alcohol is in your drink. In the [place] one drink equals one 12 oz bottle of beer (355 mL), one 5 oz glass of wine (148 mL), or one 1 oz glass of hard liquor (44 mL). Lifestyle Do not use any products that contain nicotine or tobacco. These products include cigarettes, chewing tobacco, and vaping devices, such as e-cigarettes. If you need help quitting, ask your health care provider. Do not use street drugs. Do not share needles. Ask your health care provider for help if you need support or information about quitting drugs. General instructions Schedule regular health, dental, and eye exams. Stay current with your vaccines. Tell your health care provider if: You often feel depressed. You have ever been abused or do not feel safe at home. Summary Adopting a healthy lifestyle and getting preventive care are important in promoting health and wellness. Follow your health care provider's instructions about healthy diet, exercising, and getting tested or screened for diseases. Follow your health care provider's instructions on monitoring your cholesterol and blood pressure. This information is not intended to replace advice given to you by your health care provider. Make sure you discuss any questions you have with your health care provider. Document Revised: 10/16/2020 Document Reviewed: 10/16/2020 Elsevier Patient Education  2023 Elsevier Inc.  

## 2022-08-02 NOTE — Progress Notes (Signed)
Office Note 08/02/2022  CC:  Chief Complaint  Patient presents with   Establish Care    HPI:  James Pittman is a 22 y.o. male who is here to establish care and discuss allergies/asthma. Patient's most recent primary MD: none Old records were reviewed prior to or during today's visit.  Reviewed allergy and immunology--Dr. Kim--note from 07/04/2022. Spirometry showed mixed obstructive and restrictive disease with 22% improvement in FEV1 postbronchodilator treatment. He was prescribed prednisone 20 mg a day for 5 days and started on Symbicort 2 puffs twice daily. He was not allergy tested because his asthma was poorly controlled. At allergist follow-up visit yesterday he was significantly improved. Pinprick testing showed positive to ragweed, weeds, trees, dust mites, cat and cockroach.  He was started on Zyrtec 10 mg nightly. Cromolyn eyedrops recommended.  He feels much better, has only had to use albuterol about 3 times in the last month.  Has acne on his face that he feels like is getting gradually worse.  He applies over-the-counter CeraVe lotion. He has a few small lesions on his chest but none on his back or shoulders.  States he has never been on prescription medication for acne before.  Past Medical History:  Diagnosis Date   Allergic rhinitis    environmental + cats   Moderate persistent asthma     Past Surgical History:  Procedure Laterality Date   broke finger Left    index    Family History  Problem Relation Age of Onset   Hypertension Mother    Alcohol abuse Mother    Depression Mother    Hyperlipidemia Mother    Mental illness Mother    Miscarriages / [place] Mother    Alcohol abuse Maternal Grandmother     Social History   Socioeconomic History   Marital status: Single    Spouse name: Not on file   Number of children: Not on file   Years of education: Not on file   Highest education level: Not on file  Occupational History   Not on file   Tobacco Use   Smoking status: Former   Smokeless tobacco: Never  Vaping Use   Vaping Use: Some days  Substance and Sexual Activity   Alcohol use: No   Drug use: No   Sexual activity: Yes    Partners: Female  Other Topics Concern   Not on file  Social History Narrative   Single, lives with his mom in [place].   [employer] grad.   Hx of long distance running.   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  Interested in medicine, will possibly do [place] training.   Vaped for brief period.   No tob/alc/drugs   Social Determinants of Health   Financial Resource Strain: Not on file  Food Insecurity: Not on file  Transportation Needs: Not on file  Physical Activity: Not on file  Stress: Not on file  Social Connections: Not on file  Intimate Partner Violence: Not on file    Outpatient Encounter Medications as of 08/02/2022  Medication Sig   albuterol (VENTOLIN HFA) 108 (90 Base) MCG/ACT inhaler Inhale 2 puffs into the lungs every 4 (four) hours as needed for wheezing or shortness of breath (coughing fits).   budesonide-formoterol (SYMBICORT) 160-4.5 MCG/ACT inhaler Inhale 2 puffs into the lungs in the morning and at bedtime.   cetirizine (ZYRTEC ALLERGY) 10 MG tablet Take 1 tablet (10 mg total) by mouth at bedtime.   clindamycin-benzoyl peroxide (BENZACLIN) gel Apply to affected areas once a  day   cromolyn (OPTICROM) 4 % ophthalmic solution Place 1 drop into both eyes 4 (four) times daily as needed (itchy/watery eyes).   No facility-administered encounter medications on file as of 08/02/2022.    Allergies  Allergen Reactions   Other     Ragweed, trees, dust mites, cat, cockroaches    Review of Systems  Constitutional:  Negative for appetite change, chills, fatigue and fever.  HENT:  Negative for congestion, dental problem, ear pain and sore throat.   Eyes:  Negative for discharge, redness and visual disturbance.  Respiratory:  Negative for cough, chest tightness, shortness of breath and  wheezing.   Cardiovascular:  Negative for chest pain, palpitations and leg swelling.  Gastrointestinal:  Negative for abdominal pain, blood in stool, diarrhea, nausea and vomiting.  Genitourinary:  Negative for difficulty urinating, dysuria, flank pain, frequency, hematuria and urgency.  Musculoskeletal:  Negative for arthralgias, back pain, joint swelling, myalgias and neck stiffness.  Skin:  Negative for pallor and rash.       Acne   Neurological:  Negative for dizziness, speech difficulty, weakness and headaches.  Hematological:  Negative for adenopathy. Does not bruise/bleed easily.  Psychiatric/Behavioral:  Negative for confusion and sleep disturbance. The patient is not nervous/anxious.     PE; Blood pressure 121/72, pulse 60, resp. rate 12, height 5' 3.75" (1.619 m), weight 136 lb (61.7 kg), SpO2 93 %. Body mass index is 23.53 kg/m.  Physical Exam  Gen: Alert, well appearing.  Patient is oriented to person, place, time, and situation. AFFECT: pleasant, lucid thought and speech. ENT: Ears: EACs clear, normal epithelium.  TMs with good light reflex and landmarks bilaterally.  Eyes: no injection, icteris, swelling, or exudate.  EOMI, PERRLA. Nose: no drainage or turbinate edema/swelling.  No injection or focal lesion.  Mouth: lips without lesion/swelling.  Oral mucosa pink and moist.  Dentition intact and without obvious caries or gingival swelling.  Oropharynx without erythema, exudate, or swelling.  Neck: supple/nontender.  No LAD, mass, or TM.  Carotid pulses 2+ bilaterally, without bruits. CV: RRR, no m/r/g.   LUNGS: CTA bilat, nonlabored resps, good aeration in all lung fields. ABD: soft, NT, ND, BS normal.  No hepatospenomegaly or mass.  No bruits. EXT: no clubbing, cyanosis, or edema.  Musculoskeletal: no joint swelling, erythema, warmth, or tenderness.  ROM of all joints intact. Skin -scattered pink comedones on the cheeks and chin.  No cystic lesions.  No  erythema. otherwise, no sores or suspicious lesions or rashes or color changes  Pertinent labs:  None  ASSESSMENT AND PLAN:   New patient, establishing care.  1) Health maintenance exam: Reviewed age and gender appropriate health maintenance issues (prudent diet, regular exercise, health risks of tobacco and excessive alcohol, use of seatbelts, fire alarms in home, use of sunscreen).  Also reviewed age and gender appropriate health screening as well as vaccine recommendations. Vaccines: All UTD No labs.  #2 acne vulgaris. Trial of BenzaClin gel, apply once daily. Therapeutic expectations and side effect profile of medication discussed today.  Patient's questions answered.  #3 allergic rhinitis and moderate persistent asthma. Now well-controlled on Symbicort 2 puffs twice daily, Zyrtec 10 mg a day, and cromolyn eyedrops as needed. Followed by Dr. Maudie Mercury.  An After Visit Summary was printed and given to the patient.  Return in about 4 weeks (around 08/30/2022) for Acne.  Signed:  Crissie Sickles, MD           08/02/2022

## 2022-08-14 ENCOUNTER — Other Ambulatory Visit (HOSPITAL_COMMUNITY): Payer: Self-pay

## 2022-08-14 ENCOUNTER — Telehealth: Payer: Self-pay

## 2022-08-14 MED ORDER — CLINDAMYCIN PHOS-BENZOYL PEROX 1.2-5 % EX GEL: CUTANEOUS | 1 refills | Status: DC

## 2022-08-14 NOTE — Telephone Encounter (Signed)
Pharmacy Patient Advocate Encounter   Received notification that prior authorization for Clindamycin Phos-Benzoyl Perox 1-5% gel is required/requested.  Per Test Claim: Prior Authoriztion required. Product non-preferred.    PA submitted on 08/14/22 to (ins) [facility] [place] via CoverMyMeds Key # [id number] Status is pending

## 2022-08-14 NOTE — Telephone Encounter (Signed)
Noted  

## 2022-08-14 NOTE — Telephone Encounter (Signed)
Please

## 2022-08-14 NOTE — Telephone Encounter (Signed)
Okay, alternative prescription sent in

## 2022-08-14 NOTE — Telephone Encounter (Signed)
noted 

## 2022-08-16 NOTE — Telephone Encounter (Signed)
Provider made aware and asked for alternative med

## 2022-08-22 ENCOUNTER — Other Ambulatory Visit (HOSPITAL_COMMUNITY)
Admission: RE | Admit: 2022-08-22 | Discharge: 2022-08-22 | Disposition: A | Discharge status: Home / Self Care | Payer: Medicaid Other | Source: Ambulatory Visit | Attending: Family Medicine | Admitting: Family Medicine

## 2022-08-22 ENCOUNTER — Encounter: Payer: Self-pay | Admitting: Family Medicine

## 2022-08-22 ENCOUNTER — Ambulatory Visit (INDEPENDENT_AMBULATORY_CARE_PROVIDER_SITE_OTHER): Payer: Medicaid Other | Admitting: Family Medicine

## 2022-08-22 VITALS — BP 129/82 | HR 74 | Temp 98.0°F | Ht 63.76 in | Wt 135.4 lb

## 2022-08-22 DIAGNOSIS — N368 Other specified disorders of urethra: Secondary | ICD-10-CM

## 2022-08-22 NOTE — Progress Notes (Signed)
OFFICE VISIT  08/22/2022  CC:  Chief Complaint  Patient presents with   Discomfort in Penile Area    Started 2-3 weeks ago, he thought it would go away on its own. Denies injury, issues with urinating or BM, abd or back pain, discharge. Currently sexually active, no protection use. Does sting a little when urinating.     Patient is a 22 y.o. male who presents for penile discomfort.  HPI: About 2 weeks ago he started noticing a little bit of a stinging pain at the very tip of his penis.  He feels like there is a small area of swelling at the hole.  No discharge, no dysuria, no visible abnormality of the urine.  He is sexually active with 1 person and does not use protection. No known contact with anyone with an STD.  He has no prior history of STD.  No pubic pain.  No skin lesions of the groin region, no swelling.  Past Medical History:  Diagnosis Date   Allergic rhinitis    environmental + cats   Moderate persistent asthma     Past Surgical History:  Procedure Laterality Date   broke finger Left    index    Outpatient Medications Prior to Visit  Medication Sig Dispense Refill   albuterol (VENTOLIN HFA) 108 (90 Base) MCG/ACT inhaler Inhale 2 puffs into the lungs every 4 (four) hours as needed for wheezing or shortness of breath (coughing fits). 18 g 1   budesonide-formoterol (SYMBICORT) 160-4.5 MCG/ACT inhaler Inhale 2 puffs into the lungs in the morning and at bedtime. 1 each 3   cetirizine (ZYRTEC ALLERGY) 10 MG tablet Take 1 tablet (10 mg total) by mouth at bedtime. 30 tablet 3   Clindamycin-Benzoyl Per, Refr, gel Apply qd for acne 45 g 1   cromolyn (OPTICROM) 4 % ophthalmic solution Place 1 drop into both eyes 4 (four) times daily as needed (itchy/watery eyes). 10 mL 3   No facility-administered medications prior to visit.    Allergies  Allergen Reactions   Other     Ragweed, trees, dust mites, cat, cockroaches    Review of Systems  As per HPI  PE:    08/22/2022    10:46 AM 08/02/2022    1:21 PM 08/01/2022    8:58 AM  Vitals with BMI  Height 5' 3.76" 5' 3.75"   Weight 135 lbs 6 oz 136 lbs   BMI 0000000 0000000   Systolic [id number]  Diastolic 82 72 76  Pulse 74 60 54     Physical Exam  Gen: Alert, well appearing.  Patient is oriented to person, place, time, and situation. AFFECT: pleasant, lucid thought and speech. Genitals normal; both testes normal without tenderness, masses, hydroceles, varicoceles, erythema or swelling. Shaft normal, circumcised, meatus normal, without discharge. No inguinal hernia noted. No inguinal lymphadenopathy.  LABS:  none  IMPRESSION AND PLAN:  Urethral meatus pain. Need to rule out GC and chlamydia--> urine sample is sent today. Recommended he apply Aquaphor to the tip of his penis 2-3 times a day.  An After Visit Summary was printed and given to the patient.  FOLLOW UP: Return if symptoms worsen or fail to improve.  Signed:  Crissie Sickles, MD           08/22/2022

## 2022-08-23 LAB — URINE CYTOLOGY ANCILLARY ONLY
Chlamydia: NEGATIVE
Comment: NEGATIVE
Comment: NORMAL
Neisseria Gonorrhea: NEGATIVE

## 2022-08-30 ENCOUNTER — Encounter: Payer: Self-pay | Admitting: Family Medicine

## 2022-08-30 ENCOUNTER — Ambulatory Visit (INDEPENDENT_AMBULATORY_CARE_PROVIDER_SITE_OTHER): Payer: Medicaid Other | Admitting: Family Medicine

## 2022-08-30 VITALS — BP 130/85 | HR 62 | Ht 63.76 in | Wt 134.8 lb

## 2022-08-30 DIAGNOSIS — L700 Acne vulgaris: Secondary | ICD-10-CM | POA: Diagnosis not present

## 2022-08-30 NOTE — Progress Notes (Signed)
OFFICE VISIT  08/30/2022  CC: f/u acne  Patient is a 22 y.o. male who presents for 1 month follow-up acne. I prescribed him BenzaClin gel last visit.  INTERIM HX: Has not had a chance to get on good regular schedule of using his gel.  He is back and forth between his house and his mom's house and tends to go a few days between treatments. He does not really notice any difference in his acne.   Past Medical History:  Diagnosis Date   Allergic rhinitis    environmental + cats   Moderate persistent asthma     Past Surgical History:  Procedure Laterality Date   broke finger Left    index    Outpatient Medications Prior to Visit  Medication Sig Dispense Refill   albuterol (VENTOLIN HFA) 108 (90 Base) MCG/ACT inhaler Inhale 2 puffs into the lungs every 4 (four) hours as needed for wheezing or shortness of breath (coughing fits). 18 g 1   budesonide-formoterol (SYMBICORT) 160-4.5 MCG/ACT inhaler Inhale 2 puffs into the lungs in the morning and at bedtime. 1 each 3   cetirizine (ZYRTEC ALLERGY) 10 MG tablet Take 1 tablet (10 mg total) by mouth at bedtime. 30 tablet 3   Clindamycin-Benzoyl Per, Refr, gel Apply qd for acne 45 g 1   cromolyn (OPTICROM) 4 % ophthalmic solution Place 1 drop into both eyes 4 (four) times daily as needed (itchy/watery eyes). 10 mL 3   No facility-administered medications prior to visit.    Allergies  Allergen Reactions   Other     Ragweed, trees, dust mites, cat, cockroaches    Review of Systems As per HPI  PE:    08/30/2022    1:01 PM 08/22/2022   10:46 AM 08/02/2022    1:21 PM  Vitals with BMI  Height 5' 3.76" 5' 3.76" 5' 3.75"  Weight 134 lbs 13 oz 135 lbs 6 oz 136 lbs  BMI 23.3 0000000 0000000  Systolic [id number]  Diastolic 85 82 72  Pulse 62 74 60     Physical Exam  Gen: Alert, well appearing.  Patient is oriented to person, place, time, and situation. Face: Cheeks and chin and neck with some scattered dry comedonal lesions.  No  cystic lesions or significant erythema.  LABS:  None  IMPRESSION AND PLAN:  Acne vulgaris. No adverse effects from BenzaClin gel but at this point it is hard to judge the efficacy -->we need to give him longer on it and he will try to get on a very regular schedule of using it and we will reassess in 2 months.  An After Visit Summary was printed and given to the patient.  FOLLOW UP: Return in about 2 months (around 10/30/2022) for f/u acne.  Signed:  Crissie Sickles, MD           08/30/2022

## 2022-10-02 DIAGNOSIS — H1010 Acute atopic conjunctivitis, unspecified eye: Secondary | ICD-10-CM | POA: Insufficient documentation

## 2022-10-02 NOTE — Progress Notes (Deleted)
Follow Up Note  RE: James Pittman MRN: [id number] DOB: April 12, 2001 Date of Office Visit: 10/03/2022  Referring provider: Gwenlyn Fudge, FNP Primary care provider: Jeoffrey Massed, MD  Chief Complaint: No chief complaint on file.  History of Present Illness: I had the pleasure of seeing James Pittman for a follow up visit at the [facility] on 10/02/2022. He is a 22 y.o. male, who is being followed for asthma, allergic rhinoconjunctivitis and rash. His previous allergy office visit was on 08/01/2022 with Dr. Selena Batten. Today is a regular follow up visit.  Moderate persistent asthma without complication Past history - diagnosed with asthma over 10 years ago and noted worsening symptom the past 5 years. Currently only using albuterol on a daily basis. He is not sure if he had a daily maintenance inhaler in the past. No triggers noted. Covid-19 in 2020. 2023 CXR unremarkable. 2024 spirometry showed: mixed obstructive and restrictive disease with 22% improvement in FEV1 post bronchodilator treatment. Clinically feeling slightly improved.  Interim history - doing much better with daily Symbicort and no albuterol use the past 3 weeks. Quit vaping.  Today's spirometry was normal. Daily controller medication(s): continue Symbicort 2 puffs twice a day with spacer and rinse mouth afterwards. Must take everyday!!! May use albuterol rescue inhaler 2 puffs every 4 to 6 hours as needed for shortness of breath, chest tightness, coughing, and wheezing. May use albuterol rescue inhaler 2 puffs 5 to 15 minutes prior to strenuous physical activities. Monitor frequency of use.  Get spirometry at next visit.   Other allergic rhinitis Past history - Cats flare rhino conjunctivitis symptoms and asthma.  Today's skin prick testing showed: Positive to ragweed, weed, trees, dust mites, cat and cockroach.  Declined intradermal testing.  Start environmental control measures as below. Take  Zyrtec (cetirizine)  daily at night. May take twice a day during allergy flares. May switch antihistamines every few months. Use cromolyn 4% 1 drop in each eye up to four times a day as needed for itchy/watery eyes.  Consider allergy injections for long term control if above medications do not help the symptoms - handout given.  Will need to get bloodwork first.   Allergic conjunctivitis of both eyes See assessment and plan as above.   Rash and other nonspecific skin eruption Rash on the back. No triggers noted.  Keep track of rash breakouts and take pictures if it happens again. May need to get bloodwork if you keep breaking out.  The zyrtec  daily should help with this.   Return in about 2 months (around 09/30/2022).  Assessment and Plan: James Pittman is a 22 y.o. male with: No problem-specific Assessment & Plan notes found for this encounter.  No follow-ups on file.  No orders of the defined types were placed in this encounter.  Lab Orders  No laboratory test(s) ordered today    Diagnostics: Spirometry:  Tracings reviewed. His effort: {Blank single:19197::"Good reproducible efforts.","It was hard to get consistent efforts and there is a question as to whether this reflects a maximal maneuver.","Poor effort, data can not be interpreted."} FVC: ***L FEV1: ***L, ***% predicted FEV1/FVC ratio: ***% Interpretation: {Blank single:19197::"Spirometry consistent with mild obstructive disease","Spirometry consistent with moderate obstructive disease","Spirometry consistent with severe obstructive disease","Spirometry consistent with possible restrictive disease","Spirometry consistent with mixed obstructive and restrictive disease","Spirometry uninterpretable due to technique","Spirometry consistent with normal pattern","No overt abnormalities noted given today's efforts"}.  Please see scanned spirometry results for details.  Skin Testing: {Blank  single:19197::"Select  foods","Environmental allergy panel","Environmental allergy panel and select foods","Food allergy panel","None","Deferred due to recent antihistamines use"}. *** Results discussed with patient/family.   Medication List:  Current Outpatient Medications  Medication Sig Dispense Refill   albuterol (VENTOLIN HFA) 108 (90 Base) MCG/ACT inhaler Inhale 2 puffs into the lungs every 4 (four) hours as needed for wheezing or shortness of breath (coughing fits). 18 g 1   budesonide-formoterol (SYMBICORT) 160-4.5 MCG/ACT inhaler Inhale 2 puffs into the lungs in the morning and at bedtime. 1 each 3   cetirizine (ZYRTEC ALLERGY) 10 MG tablet Take 1 tablet (10 mg total) by mouth at bedtime. 30 tablet 3   Clindamycin-Benzoyl Per, Refr, gel Apply qd for acne 45 g 1   cromolyn (OPTICROM) 4 % ophthalmic solution Place 1 drop into both eyes 4 (four) times daily as needed (itchy/watery eyes). 10 mL 3   No current facility-administered medications for this visit.   Allergies: Allergies  Allergen Reactions   Other     Ragweed, trees, dust mites, cat, cockroaches   I reviewed his past medical history, social history, family history, and environmental history and no significant changes have been reported from his previous visit.  Review of Systems  Constitutional:  Negative for appetite change, chills, fever and unexpected weight change.  HENT:  Negative for congestion and rhinorrhea.   Eyes:  Positive for itching.  Respiratory:  Negative for cough, chest tightness, shortness of breath and wheezing.   Cardiovascular:  Negative for chest pain.  Gastrointestinal:  Negative for abdominal pain.  Genitourinary:  Negative for difficulty urinating.  Skin:  Positive for rash.  Allergic/Immunologic: Positive for environmental allergies.  Neurological:  Negative for headaches.    Objective: There were no vitals taken for this visit. There is no height or weight on file to calculate BMI. Physical Exam Vitals and  nursing note reviewed.  Constitutional:      Appearance: Normal appearance. He is well-developed.  HENT:     Head: Normocephalic and atraumatic.     Right Ear: Tympanic membrane and external ear normal.     Left Ear: Tympanic membrane and external ear normal.     Nose: Nose normal.     Mouth/Throat:     Mouth: Mucous membranes are moist.     Pharynx: Oropharynx is clear.  Eyes:     Conjunctiva/sclera: Conjunctivae normal.  Cardiovascular:     Rate and Rhythm: Normal rate and regular rhythm.     Heart sounds: Normal heart sounds. No murmur heard. Pulmonary:     Effort: Pulmonary effort is normal.     Breath sounds: Normal breath sounds. No wheezing, rhonchi or rales.  Musculoskeletal:     Cervical back: Neck supple.  Skin:    General: Skin is warm.     Findings: No rash.  Neurological:     Mental Status: He is alert and oriented to person, place, and time.  Psychiatric:        Behavior: Behavior normal.    Previous notes and tests were reviewed. The plan was reviewed with the patient/family, and all questions/concerned were addressed.  It was my pleasure to see Oree today and participate in his care. Please feel free to contact me with any questions or concerns.  Sincerely,  Wyline Mood, DO Allergy & Immunology  [facility] office: [phone] [facility] office: [phone]

## 2022-10-03 ENCOUNTER — Ambulatory Visit: Payer: Medicaid Other | Admitting: Allergy

## 2022-10-03 DIAGNOSIS — R21 Rash and other nonspecific skin eruption: Secondary | ICD-10-CM

## 2022-10-03 DIAGNOSIS — J4540 Moderate persistent asthma, uncomplicated: Secondary | ICD-10-CM

## 2022-10-03 DIAGNOSIS — H1010 Acute atopic conjunctivitis, unspecified eye: Secondary | ICD-10-CM

## 2022-10-24 ENCOUNTER — Other Ambulatory Visit: Payer: Self-pay

## 2022-10-24 NOTE — Telephone Encounter (Signed)
Patient called in requesting a refill on albuterol. I informed patient that albuterol was sent in Feb.2024 with 1 refill. I informed patient that he should have a refill left. I asked patient is he is using albuterol frequently and he said that he is. I informed patient if he is using it a lot he should come in to be evaluated. I scheduled an appointment to come in to be seen on Tuesday 10/29/22 as he is leaving to [place] and will be back Monday 10/28/22.

## 2022-10-28 NOTE — Progress Notes (Unsigned)
Follow Up Note  RE: James Pittman MRN: [id number] DOB: 07-Dec-2000 Date of Office Visit: 10/29/2022  Referring provider: Jeoffrey Massed, MD Primary care provider: Jeoffrey Massed, MD  Chief Complaint: No chief complaint on file.  History of Present Illness: I had the pleasure of seeing James Pittman for a follow up visit at the [facility] on 10/28/2022. He is a 22 y.o. male, who is being followed for asthma, allergic rhinoconjunctivitis, rash. His previous allergy office visit was on 08/01/2022 with Dr. Selena Batten. Today is a regular follow up visit.  Moderate persistent asthma without complication Past history - diagnosed with asthma over 10 years ago and noted worsening symptom the past 5 years. Currently only using albuterol on a daily basis. He is not sure if he had a daily maintenance inhaler in the past. No triggers noted. Covid-19 in 2020. 2023 CXR unremarkable. 2024 spirometry showed: mixed obstructive and restrictive disease with 22% improvement in FEV1 post bronchodilator treatment. Clinically feeling slightly improved.  Interim history - doing much better with daily Symbicort and no albuterol use the past 3 weeks. Quit vaping.  Today's spirometry was normal. Daily controller medication(s): continue Symbicort 2 puffs twice a day with spacer and rinse mouth afterwards. Must take everyday!!! May use albuterol rescue inhaler 2 puffs every 4 to 6 hours as needed for shortness of breath, chest tightness, coughing, and wheezing. May use albuterol rescue inhaler 2 puffs 5 to 15 minutes prior to strenuous physical activities. Monitor frequency of use.  Get spirometry at next visit.   Other allergic rhinitis Past history - Cats flare rhino conjunctivitis symptoms and asthma.  Today's skin prick testing showed: Positive to ragweed, weed, trees, dust mites, cat and cockroach.  Declined intradermal testing.  Start environmental control measures as below. Take Zyrtec  (cetirizine) 10mg  daily at night. May take twice a day during allergy flares. May switch antihistamines every few months. Use cromolyn 4% 1 drop in each eye up to four times a day as needed for itchy/watery eyes.  Consider allergy injections for long term control if above medications do not help the symptoms - handout given.  Will need to get bloodwork first.   Allergic conjunctivitis of both eyes See assessment and plan as above.   Rash and other nonspecific skin eruption Rash on the back. No triggers noted.  Keep track of rash breakouts and take pictures if it happens again. May need to get bloodwork if you keep breaking out.  The zyrtec 10mg  daily should help with this.   Return in about 2 months (around 09/30/2022).  Assessment and Plan: James Pittman is a 22 y.o. male with: No problem-specific Assessment & Plan notes found for this encounter.  No follow-ups on file.  No orders of the defined types were placed in this encounter.  Lab Orders  No laboratory test(s) ordered today    Diagnostics: Spirometry:  Tracings reviewed. His effort: {Blank single:19197::"Good reproducible efforts.","It was hard to get consistent efforts and there is a question as to whether this reflects a maximal maneuver.","Poor effort, data can not be interpreted."} FVC: ***L FEV1: ***L, ***% predicted FEV1/FVC ratio: ***% Interpretation: {Blank single:19197::"Spirometry consistent with mild obstructive disease","Spirometry consistent with moderate obstructive disease","Spirometry consistent with severe obstructive disease","Spirometry consistent with possible restrictive disease","Spirometry consistent with mixed obstructive and restrictive disease","Spirometry uninterpretable due to technique","Spirometry consistent with normal pattern","No overt abnormalities noted given today's efforts"}.  Please see scanned spirometry results for details.  Skin Testing: {Blank single:19197::"Select  foods","Environmental  allergy panel","Environmental allergy panel and select foods","Food allergy panel","None","Deferred due to recent antihistamines use"}. *** Results discussed with patient/family.   Medication List:  Current Outpatient Medications  Medication Sig Dispense Refill  . albuterol (VENTOLIN HFA) 108 (90 Base) MCG/ACT inhaler Inhale 2 puffs into the lungs every 4 (four) hours as needed for wheezing or shortness of breath (coughing fits). 18 g 1  . budesonide-formoterol (SYMBICORT) 160-4.5 MCG/ACT inhaler Inhale 2 puffs into the lungs in the morning and at bedtime. 1 each 3  . cetirizine (ZYRTEC ALLERGY) 10 MG tablet Take 1 tablet (10 mg total) by mouth at bedtime. 30 tablet 3  . Clindamycin-Benzoyl Per, Refr, gel Apply qd for acne 45 g 1  . cromolyn (OPTICROM) 4 % ophthalmic solution Place 1 drop into both eyes 4 (four) times daily as needed (itchy/watery eyes). 10 mL 3   No current facility-administered medications for this visit.   Allergies: Allergies  Allergen Reactions  . Other     Ragweed, trees, dust mites, cat, cockroaches   I reviewed his past medical history, social history, family history, and environmental history and no significant changes have been reported from his previous visit.  Review of Systems  Constitutional:  Negative for appetite change, chills, fever and unexpected weight change.  HENT:  Negative for congestion and rhinorrhea.   Eyes:  Positive for itching.  Respiratory:  Negative for cough, chest tightness, shortness of breath and wheezing.   Cardiovascular:  Negative for chest pain.  Gastrointestinal:  Negative for abdominal pain.  Genitourinary:  Negative for difficulty urinating.  Skin:  Positive for rash.  Allergic/Immunologic: Positive for environmental allergies.  Neurological:  Negative for headaches.   Objective: There were no vitals taken for this visit. There is no height or weight on file to calculate BMI. Physical Exam Vitals and nursing note  reviewed.  Constitutional:      Appearance: Normal appearance. He is well-developed.  HENT:     Head: Normocephalic and atraumatic.     Right Ear: Tympanic membrane and external ear normal.     Left Ear: Tympanic membrane and external ear normal.     Nose: Nose normal.     Mouth/Throat:     Mouth: Mucous membranes are moist.     Pharynx: Oropharynx is clear.  Eyes:     Conjunctiva/sclera: Conjunctivae normal.  Cardiovascular:     Rate and Rhythm: Normal rate and regular rhythm.     Heart sounds: Normal heart sounds. No murmur heard. Pulmonary:     Effort: Pulmonary effort is normal.     Breath sounds: Normal breath sounds. No wheezing, rhonchi or rales.  Musculoskeletal:     Cervical back: Neck supple.  Skin:    General: Skin is warm.     Findings: No rash.  Neurological:     Mental Status: He is alert and oriented to person, place, and time.  Psychiatric:        Behavior: Behavior normal.  Previous notes and tests were reviewed. The plan was reviewed with the patient/family, and all questions/concerned were addressed.  It was my pleasure to see James Pittman today and participate in his care. Please feel free to contact me with any questions or concerns.  Sincerely,  Wyline Mood, DO Allergy & Immunology  [facility] office: [phone] [facility] office: [phone]

## 2022-10-29 ENCOUNTER — Ambulatory Visit (INDEPENDENT_AMBULATORY_CARE_PROVIDER_SITE_OTHER): Payer: Medicaid Other | Admitting: Allergy

## 2022-10-29 ENCOUNTER — Encounter: Payer: Self-pay | Admitting: Allergy

## 2022-10-29 VITALS — BP 120/70 | HR 80 | Temp 98.7°F | Resp 18

## 2022-10-29 DIAGNOSIS — H1013 Acute atopic conjunctivitis, bilateral: Secondary | ICD-10-CM

## 2022-10-29 DIAGNOSIS — R21 Rash and other nonspecific skin eruption: Secondary | ICD-10-CM

## 2022-10-29 DIAGNOSIS — J302 Other seasonal allergic rhinitis: Secondary | ICD-10-CM | POA: Diagnosis not present

## 2022-10-29 DIAGNOSIS — J4540 Moderate persistent asthma, uncomplicated: Secondary | ICD-10-CM | POA: Diagnosis not present

## 2022-10-29 DIAGNOSIS — J3089 Other allergic rhinitis: Secondary | ICD-10-CM

## 2022-10-29 MED ORDER — ALBUTEROL SULFATE HFA 108 (90 BASE) MCG/ACT IN AERS: 2.0000 | INHALATION_SPRAY | RESPIRATORY_TRACT | 1 refills | Status: DC | PRN

## 2022-10-29 MED ORDER — BUDESONIDE-FORMOTEROL FUMARATE 160-4.5 MCG/ACT IN AERO: 2.0000 | INHALATION_SPRAY | Freq: Two times a day (BID) | RESPIRATORY_TRACT | 5 refills | Status: DC

## 2022-10-29 MED ORDER — MONTELUKAST SODIUM 10 MG PO TABS: 10.0000 mg | ORAL_TABLET | Freq: Every day | ORAL | 3 refills | Status: AC

## 2022-10-29 NOTE — Assessment & Plan Note (Signed)
Past history - diagnosed with asthma over 10 years ago and noted worsening symptom the past 5 years. Currently only using albuterol on a daily basis. He is not sure if he had a daily maintenance inhaler in the past. Covid-19 in 2020. 2023 CXR unremarkable. 2024 spirometry showed: mixed obstructive and restrictive disease with 22% improvement in FEV1 post bronchodilator treatment. Clinically feeling slightly improved.  Interim history - doing much better with Symbicort but only using once a day. Needing to use albuterol 4 times per week. Wants to join [employer]. Today's spirometry was normal. Daily controller medication(s): continue Symbicort 2 puffs TWICE a day with spacer and rinse mouth afterwards. Start Singulair (montelukast) 10mg  daily at night. Cautioned that in some children/adults can experience behavioral changes including hyperactivity, agitation, depression, sleep disturbances and suicidal ideations. These side effects are rare, but if you notice them you should notify me and discontinue Singulair (montelukast). May use albuterol rescue inhaler 2 puffs every 4 to 6 hours as needed for shortness of breath, chest tightness, coughing, and wheezing. May use albuterol rescue inhaler 2 puffs 5 to 15 minutes prior to strenuous physical activities. Monitor frequency of use. Get spirometry at next visit. 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

## 2022-10-29 NOTE — Assessment & Plan Note (Signed)
Past history - Cats flare rhino conjunctivitis symptoms and asthma. 2024 skin prick testing showed: Positive to ragweed, weed, trees, dust mites, cat and cockroach. Declined intradermal testing.  Interim history - had some symptoms but didn't take any meds for this.  Continue environmental control measures as below. Start Singulair (montelukast) 10mg  daily at night. Take Zyrtec (cetirizine) 10mg  daily at night. May take twice a day during allergy flares. May switch antihistamines every few months. Consider allergy injections for long term control if above medications do not help the symptoms.  Get bloodwork.

## 2022-10-29 NOTE — Patient Instructions (Addendum)
Asthma:  Daily controller medication(s): continue Symbicort 2 puffs TWICE a day with spacer and rinse mouth afterwards. Start Singulair (montelukast) 10mg  daily at night. Cautioned that in some children/adults can experience behavioral changes including hyperactivity, agitation, depression, sleep disturbances and suicidal ideations. These side effects are rare, but if you notice them you should notify me and discontinue Singulair (montelukast). May use albuterol rescue inhaler 2 puffs every 4 to 6 hours as needed for shortness of breath, chest tightness, coughing, and wheezing. May use albuterol rescue inhaler 2 puffs 5 to 15 minutes prior to strenuous physical activities. Monitor frequency of use.  Breathing control goals:  Full participation in all desired activities (may need albuterol before activity) Albuterol use two times or less a week on average (not counting use with activity) Cough interfering with sleep two times or less a month Oral steroids no more than once a year No hospitalizations   Environmental allergies 2024 skin testing showed: Positive to ragweed, weed, trees, dust mites, cat and cockroach.  Continue environmental control measures as below. Start Singulair (montelukast) 10mg  daily at night. Take Zyrtec (cetirizine) 10mg  daily at night. May take twice a day during allergy flares. May switch antihistamines every few months. Consider allergy injections for long term control if above medications do not help the symptoms.   Get bloodwork We are ordering labs, so please allow 1-2 weeks for the results to come back. With the newly implemented Cures Act, the labs might be visible to you at the same time that they become visible to me. However, I will not address the results until all of the results are back, so please be patient.  In the meantime, continue recommendations in your patient instructions, including avoidance measures (if applicable), until you hear from  me.  Follow up in 2 months or sooner if needed.   Reducing Pollen Exposure Pollen seasons: trees (spring), grass (summer) and ragweed/weeds (fall). Keep windows closed in your home and car to lower pollen exposure.  Install air conditioning in the bedroom and throughout the house if possible.  Avoid going out in dry windy days - especially early morning. Pollen counts are highest between 5 - 10 AM and on dry, hot and windy days.  Save outside activities for late afternoon or after a heavy rain, when pollen levels are lower.  Avoid mowing of grass if you have grass pollen allergy. Be aware that pollen can also be transported indoors on people and pets.  Dry your clothes in an automatic dryer rather than hanging them outside where they might collect pollen.  Rinse hair and eyes before bedtime. Control of House Dust Mite Allergen Dust mite allergens are a common trigger of allergy and asthma symptoms. While they can be found throughout the house, these microscopic creatures thrive in warm, humid environments such as bedding, upholstered furniture and carpeting. Because so much time is spent in the bedroom, it is essential to reduce mite levels there.  Encase pillows, mattresses, and box springs in special allergen-proof fabric covers or airtight, zippered plastic covers.  Bedding should be washed weekly in hot water (130 F) and dried in a hot dryer. Allergen-proof covers are available for comforters and pillows that can't be regularly washed.  Wash the allergy-proof covers every few months. Minimize clutter in the bedroom. Keep pets out of the bedroom.  Keep humidity less than 50% by using a dehumidifier or air conditioning. You can buy a humidity measuring device called a hygrometer to monitor this.  If  possible, replace carpets with hardwood, linoleum, or washable area rugs. If that's not possible, vacuum frequently with a vacuum that has a HEPA filter. Remove all upholstered furniture and  non-washable window drapes from the bedroom. Remove all non-washable stuffed toys from the bedroom.  Wash stuffed toys weekly. Pet Allergen Avoidance: Contrary to popular opinion, there are no "hypoallergenic" breeds of dogs or cats. That is because people are not allergic to an animal's hair, but to an allergen found in the animal's saliva, dander (dead skin flakes) or urine. Pet allergy symptoms typically occur within minutes. For some people, symptoms can build up and become most severe 8 to 12 hours after contact with the animal. People with severe allergies can experience reactions in public places if dander has been transported on the pet owners' clothing. Keeping an animal outdoors is only a partial solution, since homes with pets in the yard still have higher concentrations of animal allergens. Before getting a pet, ask your allergist to determine if you are allergic to animals. If your pet is already considered part of your family, try to minimize contact and keep the pet out of the bedroom and other rooms where you spend a great deal of time. As with dust mites, vacuum carpets often or replace carpet with a hardwood floor, tile or linoleum. High-efficiency particulate air (HEPA) cleaners can reduce allergen levels over time. While dander and saliva are the source of cat and dog allergens, urine is the source of allergens from rabbits, hamsters, mice and [place] pigs; so ask a non-allergic family member to clean the animal's cage. If you have a pet allergy, talk to your allergist about the potential for allergy immunotherapy (allergy shots). This strategy can often provide long-term relief. Cockroach Allergen Avoidance Cockroaches are often found in the homes of densely populated urban areas, schools or commercial buildings, but these creatures can lurk almost anywhere. This does not mean that you have a dirty house or living area. Block all areas where roaches can enter the home. This includes  crevices, wall cracks and windows.  Cockroaches need water to survive, so fix and seal all leaky faucets and pipes. Have an exterminator go through the house when your family and pets are gone to eliminate any remaining roaches. Keep food in lidded containers and put pet food dishes away after your pets are done eating. Vacuum and sweep the floor after meals, and take out garbage and recyclables. Use lidded garbage containers in the kitchen. Wash dishes immediately after use and clean under stoves, refrigerators or toasters where crumbs can accumulate. Wipe off the stove and other kitchen surfaces and cupboards regularly.

## 2022-10-30 ENCOUNTER — Telehealth: Payer: Self-pay

## 2022-10-30 NOTE — Telephone Encounter (Signed)
Staff Sgt. Kimbroug, Recruiter called in - DOB verified - requesting office notes and Spirometry tests to submit to [facility] on behalf of patient.  I advised I needed verbal authorization to speak to Staff Sgt. Kimbroug - patient gave verbal authorization.  Patient was advised copies of his office notes and spirometry tests could be obtained vis his myChart - patient/Staff Sgt. Kimbroug stated he was not sure how to print off the information.  Patient advised I would print his office notes and spirometry tests for him - he can come by office [facility] [address] - to pick them up.  Patient verbalized understanding, no further questions.

## 2023-01-02 ENCOUNTER — Ambulatory Visit: Payer: Self-pay | Admitting: Allergy

## 2023-02-24 ENCOUNTER — Telehealth: Payer: Self-pay | Admitting: Allergy

## 2023-02-24 MED ORDER — ALBUTEROL SULFATE HFA 108 (90 BASE) MCG/ACT IN AERS
2.0000 | INHALATION_SPRAY | RESPIRATORY_TRACT | 1 refills | Status: AC | PRN
Start: 2023-02-24 — End: ?

## 2023-02-24 NOTE — Telephone Encounter (Signed)
Patient called stating he needs a refill on his Ventolin inhaler.

## 2023-02-24 NOTE — Telephone Encounter (Signed)
Sent in refill after verify pharmacy with pt [facility] [address] in [place]

## 2023-12-23 ENCOUNTER — Ambulatory Visit (INDEPENDENT_AMBULATORY_CARE_PROVIDER_SITE_OTHER): Payer: Self-pay | Admitting: Allergy

## 2023-12-23 ENCOUNTER — Encounter: Payer: Self-pay | Admitting: Allergy

## 2023-12-23 VITALS — BP 128/88 | HR 55 | Temp 98.4°F | Resp 20 | Ht 62.75 in | Wt 142.0 lb

## 2023-12-23 DIAGNOSIS — J3089 Other allergic rhinitis: Secondary | ICD-10-CM

## 2023-12-23 DIAGNOSIS — J302 Other seasonal allergic rhinitis: Secondary | ICD-10-CM

## 2023-12-23 DIAGNOSIS — J4540 Moderate persistent asthma, uncomplicated: Secondary | ICD-10-CM

## 2023-12-23 DIAGNOSIS — H1013 Acute atopic conjunctivitis, bilateral: Secondary | ICD-10-CM

## 2023-12-23 MED ORDER — BUDESONIDE-FORMOTEROL FUMARATE 160-4.5 MCG/ACT IN AERO: 2.0000 | INHALATION_SPRAY | Freq: Every day | RESPIRATORY_TRACT | 3 refills | Status: AC

## 2023-12-23 NOTE — Patient Instructions (Addendum)
 Asthma:  FMLA forms filled out. If you take your Symbicort  inhaler everyday, you should NOT have as many asthma flares where you have to miss work.  Daily controller medication(s): restart Symbicort  160mcg 2 puffs ONCE a day with spacer and rinse mouth afterwards. Use the coupon and it should be affordable. If you can't afford it then let our office know ASAP.  During respiratory infections/flares:  Increase Symbicort  160mg  2 puffs twice a day with spacer and rinse mouth afterwards for 1-2 weeks until your breathing symptoms return to baseline.  May use albuterol  rescue inhaler 2 puffs every 4 to 6 hours as needed for shortness of breath, chest tightness, coughing, and wheezing. May use albuterol  rescue inhaler 2 puffs 5 to 15 minutes prior to strenuous physical activities. Monitor frequency of use - if you need to use it more than twice per week on a consistent basis let us  know.  Breathing control goals:  Full participation in all desired activities (may need albuterol  before activity) Albuterol  use two times or less a week on average (not counting use with activity) Cough interfering with sleep two times or less a month Oral steroids no more than once a year No hospitalizations   Environmental allergies 2024 skin testing positive to ragweed, weed, trees, dust mites, cat and cockroach.  Continue environmental control measures.  Take Zyrtec  (cetirizine ) 10mg  daily at night. May take twice a day during allergy  flares. May switch antihistamines every few months. Consider allergy  injections for long term control if above medications do not help the symptoms.   Follow up in 4 months or sooner if needed.   Reducing Pollen Exposure Pollen seasons: trees (spring), grass (summer) and ragweed/weeds (fall). Keep windows closed in your home and car to lower pollen exposure.  Install air conditioning in the bedroom and throughout the house if possible.  Avoid going out in dry windy days -  especially early morning. Pollen counts are highest between 5 - 10 AM and on dry, hot and windy days.  Save outside activities for late afternoon or after a heavy rain, when pollen levels are lower.  Avoid mowing of grass if you have grass pollen allergy . Be aware that pollen can also be transported indoors on people and pets.  Dry your clothes in an automatic dryer rather than hanging them outside where they might collect pollen.  Rinse hair and eyes before bedtime. Control of House Dust Mite Allergen Dust mite allergens are a common trigger of allergy  and asthma symptoms. While they can be found throughout the house, these microscopic creatures thrive in warm, humid environments such as bedding, upholstered furniture and carpeting. Because so much time is spent in the bedroom, it is essential to reduce mite levels there.  Encase pillows, mattresses, and box springs in special allergen-proof fabric covers or airtight, zippered plastic covers.  Bedding should be washed weekly in hot water (130 F) and dried in a hot dryer. Allergen-proof covers are available for comforters and pillows that can't be regularly washed.  Wash the allergy -proof covers every few months. Minimize clutter in the bedroom. Keep pets out of the bedroom.  Keep humidity less than 50% by using a dehumidifier or air conditioning. You can buy a humidity measuring device called a hygrometer to monitor this.  If possible, replace carpets with hardwood, linoleum, or washable area rugs. If that's not possible, vacuum frequently with a vacuum that has a HEPA filter. Remove all upholstered furniture and non-washable window drapes from the bedroom. Remove all  non-washable stuffed toys from the bedroom.  Wash stuffed toys weekly. Pet Allergen Avoidance: Contrary to popular opinion, there are no "hypoallergenic" breeds of dogs or cats. That is because people are not allergic to an animal's hair, but to an allergen found in the animal's  saliva, dander (dead skin flakes) or urine. Pet allergy  symptoms typically occur within minutes. For some people, symptoms can build up and become most severe 8 to 12 hours after contact with the animal. People with severe allergies can experience reactions in public places if dander has been transported on the pet owners' clothing. Keeping an animal outdoors is only a partial solution, since homes with pets in the yard still have higher concentrations of animal allergens. Before getting a pet, ask your allergist to determine if you are allergic to animals. If your pet is already considered part of your family, try to minimize contact and keep the pet out of the bedroom and other rooms where you spend a great deal of time. As with dust mites, vacuum carpets often or replace carpet with a hardwood floor, tile or linoleum. High-efficiency particulate air (HEPA) cleaners can reduce allergen levels over time. While dander and saliva are the source of cat and dog allergens, urine is the source of allergens from rabbits, hamsters, mice and [place] pigs; so ask a non-allergic family member to clean the animal's cage. If you have a pet allergy , talk to your allergist about the potential for allergy  immunotherapy (allergy  shots). This strategy can often provide long-term relief. Cockroach Allergen Avoidance Cockroaches are often found in the homes of densely populated urban areas, schools or commercial buildings, but these creatures can lurk almost anywhere. This does not mean that you have a dirty house or living area. Block all areas where roaches can enter the home. This includes crevices, wall cracks and windows.  Cockroaches need water to survive, so fix and seal all leaky faucets and pipes. Have an exterminator go through the house when your family and pets are gone to eliminate any remaining roaches. Keep food in lidded containers and put pet food dishes away after your pets are done eating. Vacuum and sweep  the floor after meals, and take out garbage and recyclables. Use lidded garbage containers in the kitchen. Wash dishes immediately after use and clean under stoves, refrigerators or toasters where crumbs can accumulate. Wipe off the stove and other kitchen surfaces and cupboards regularly.

## 2023-12-23 NOTE — Progress Notes (Signed)
 Follow Up Note  RE: James Pittman MRN: [id number] DOB: Nov 01, 2000 Date of Office Visit: 12/23/2023  Referring provider: Candise Aleene DEL, MD Primary care provider: Candise Aleene DEL, MD  Chief Complaint: Follow-up (Asthma follow up has issues in winter requesting FMLA)  History of Present Illness: I had the pleasure of seeing James Pittman for a follow up visit at the [facility] on 12/23/2023. He is a 23 y.o. male, who is being followed for asthma, allergic rhino conjunctivitis. His previous allergy  office visit was on 10/29/2022 with Dr. Luke. Today is a regular follow up visit. Failed to follow up as recommended.  Discussed the use of AI scribe software for clinical note transcription with the patient, who gave verbal consent to proceed.     He experiences asthma symptoms, particularly when exposed to triggers such as cold environments or being in a house with a cat. He sometimes wakes up unable to breathe if the environment is too cold or if he has been around cats.  He uses his Symbicort  inhaler 'sometimes' and still has one from about a year ago. He uses it 'sometimes' and has not renewed his prescription due to lack of insurance. He has not taken Singulair  recently and has not needed to use his rescue inhaler in the past two months.  He missed work on a recent Sunday due to breathing difficulties and has had to miss work previously due to asthma symptoms. He has not visited the ER or urgent care since his last appointment and has not required steroids.  He had the flu about six months ago, which exacerbated his asthma symptoms at the time and had miss work as well. He has not had any new medications, surgeries, or medical diagnoses since his last visit.  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 He lives with his father and occasionally visits his mother, who has a cat, which can trigger his  allergies. He does not drink, smoke, or vape, and he continues to work out regularly without significant breathing issues.     Assessment and Plan: [name] is a 23 y.o. male with: Not well controlled moderate persistent asthma Past history - diagnosed with asthma over 10 years ago and noted worsening symptom the past 5 years. Currently only using albuterol  on a daily basis. He is not sure if he had a daily maintenance inhaler in the past. Covid-19 in 2020. 2023 CXR unremarkable. 2024 spirometry showed: mixed obstructive and restrictive disease with 22% improvement in FEV1 post bronchodilator treatment. Clinically feeling slightly improved.  Interim history - not using Symbicort  daily and having to miss work as waking up with difficulty breathing. No ER/UC/systemic steroids since last visit. Needs FMLA forms filled out.  Today's spirometry showed mild obstruction. FMLA forms filled out. Stressed importance of daily preventative inhaler use to he does NOT have to miss work. Daily controller medication(s): restart Symbicort  160mcg 2 puffs ONCE a day with spacer and rinse mouth afterwards. Use the coupon and it should be affordable. If you can't afford it then let our office know ASAP. During respiratory infections/flares:  Increase Symbicort  160mg  2 puffs twice a day with spacer and rinse mouth afterwards for 1-2 weeks until your breathing symptoms return to baseline.  May use albuterol  rescue inhaler 2 puffs every 4 to 6 hours as needed for shortness of breath, chest tightness, coughing, and wheezing. May use albuterol  rescue inhaler  2 puffs 5 to 15 minutes prior to strenuous physical activities. Monitor frequency of use - if you need to use it more than twice per week on a consistent basis let us  know.    Seasonal and perennial allergic rhinoconjunctivitis Past history - Cats flare rhino conjunctivitis symptoms and asthma. 2024 skin prick testing positive to ragweed, weed, trees, dust mites, cat and  cockroach. Declined intradermal testing.  Interim history - only takes medications prn. Did not get bloodwork drawn.  Continue environmental control measures.  Take Zyrtec  (cetirizine ) 10mg  daily at night. May take twice a day during allergy  flares. May switch antihistamines every few months. Consider allergy  injections for long term control if above medications do not help the symptoms.   Return in about 4 months (around 04/24/2024).  Meds ordered this encounter  Medications   budesonide -formoterol  (SYMBICORT ) 160-4.5 MCG/ACT inhaler    Sig: Inhale 2 puffs into the lungs daily. with spacer and rinse mouth afterwards. Take 2 puffs twice a day during asthma flares for 1-2 weeks.    Dispense:  1 each    Refill:  3    Please run coupon code [id number], PCN# [id number], HME#[id number], PI#[id number]   Lab Orders  No laboratory test(s) ordered today    Diagnostics: Spirometry:  Tracings reviewed. His effort: Good reproducible efforts. FVC: 3.75L FEV1: 2.47L, 72% predicted FEV1/FVC ratio: 66% Interpretation: Spirometry consistent with mild obstructive disease.  Please see scanned spirometry results for details.  Results discussed with patient/family.   Medication List:  Current Outpatient Medications  Medication Sig Dispense Refill   albuterol  (VENTOLIN  HFA) 108 (90 Base) MCG/ACT inhaler Inhale 2 puffs into the lungs every 4 (four) hours as needed for wheezing or shortness of breath (coughing fits). 18 g 1   budesonide -formoterol  (SYMBICORT ) 160-4.5 MCG/ACT inhaler Inhale 2 puffs into the lungs daily. with spacer and rinse mouth afterwards. Take 2 puffs twice a day during asthma flares for 1-2 weeks. 1 each 3   montelukast  (SINGULAIR ) 10 MG tablet Take 1 tablet (10 mg total) by mouth at bedtime. 30 tablet 3   cetirizine  (ZYRTEC  ALLERGY ) 10 MG tablet Take 1 tablet (10 mg total) by mouth at bedtime. (Patient not taking: Reported on 10/29/2022) 30 tablet 3   No current facility-administered  medications for this visit.   Allergies: Allergies  Allergen Reactions   Other     Ragweed, trees, dust mites, cat, cockroaches   I reviewed his past medical history, social history, family history, and environmental history and no significant changes have been reported from his previous visit.  Review of Systems  Constitutional:  Negative for appetite change, chills, fever and unexpected weight change.  HENT:  Negative for congestion and rhinorrhea.   Eyes:  Negative for itching.  Respiratory:  Positive for chest tightness and shortness of breath. Negative for cough and wheezing.   Cardiovascular:  Negative for chest pain.  Gastrointestinal:  Negative for abdominal pain.  Genitourinary:  Negative for difficulty urinating.  Skin:  Negative for rash.  Allergic/Immunologic: Positive for environmental allergies.  Neurological:  Negative for headaches.    Objective: BP 128/88   Pulse (!) 55   Temp 98.4 F (36.9 C) (Temporal)   Resp 20   Ht 5' 2.75 (1.594 m)   Wt 142 lb (64.4 kg)   SpO2 98%   BMI 25.36 kg/m  Body mass index is 25.36 kg/m. Physical Exam Vitals and nursing note reviewed.  Constitutional:      Appearance: Normal appearance. He is well-developed.  HENT:     Head: Normocephalic and atraumatic.     Right Ear: Tympanic membrane and external ear normal.     Left Ear: Tympanic membrane and external ear normal.     Nose: Nose normal.     Mouth/Throat:     Mouth: Mucous membranes are moist.     Pharynx: Oropharynx is clear.  Eyes:     Conjunctiva/sclera: Conjunctivae normal.  Cardiovascular:     Rate and Rhythm: Normal rate and regular rhythm.     Heart sounds: Normal heart sounds. No murmur heard. Pulmonary:     Effort: Pulmonary effort is normal.     Breath sounds: Normal breath sounds. No wheezing, rhonchi or rales.  Musculoskeletal:     Cervical back: Neck supple.  Skin:    General: Skin is warm.     Findings: No rash.  Neurological:     Mental  Status: He is alert and oriented to person, place, and time.  Psychiatric:        Behavior: Behavior normal.    Previous notes and tests were reviewed. The plan was reviewed with the patient/family, and all questions/concerned were addressed.  It was my pleasure to see Rock today and participate in his care. Please feel free to contact me with any questions or concerns.  Sincerely,  Orlan Cramp, DO Allergy  & Immunology  [facility]  [place] office: [phone] [facility] office: [phone]

## 2023-12-27 ENCOUNTER — Ambulatory Visit
Admission: EM | Admit: 2023-12-27 | Discharge: 2023-12-27 | Disposition: A | Discharge status: Home / Self Care | Payer: Self-pay | Attending: Family Medicine | Admitting: Family Medicine

## 2023-12-27 DIAGNOSIS — N4889 Other specified disorders of penis: Secondary | ICD-10-CM | POA: Insufficient documentation

## 2023-12-27 NOTE — Discharge Instructions (Signed)
 You can try to address an irritant balanitis by using hydrocortisone cream 1% otc twice daily for 1 week. Keep the area dry as much as possible and free of persistent friction. Hydrate with 80-100 ounces of water daily. Follow up with Urology. We'll update you on your results in 2-3 days.

## 2023-12-27 NOTE — ED Provider Notes (Signed)
 [facility]  Note:  This document was prepared using Conservation officer, historic buildings and may include unintentional dictation errors.  MRN: [id number] DOB: Feb 19, 2001  Subjective:   James Pittman is a 23 y.o. male presenting for 4 to 58-month history of persistent intermittent pain about the penile meatus.  Reports intermittent swelling and erythema about the meatus.  Denies dysuria, hematuria, urinary frequency, penile discharge, penile swelling, testicular pain, testicular swelling, anal pain, groin pain.  Has previously been tested for gonorrhea and chlamydia which was negative.  He is not opposed to testing today.  States that he drinks water regularly as it is work is fairly active.  No current facility-administered medications for this encounter.  Current Outpatient Medications:    albuterol  (VENTOLIN  HFA) 108 (90 Base) MCG/ACT inhaler, Inhale 2 puffs into the lungs every 4 (four) hours as needed for wheezing or shortness of breath (coughing fits)., Disp: 18 g, Rfl: 1   budesonide -formoterol  (SYMBICORT ) 160-4.5 MCG/ACT inhaler, Inhale 2 puffs into the lungs daily. with spacer and rinse mouth afterwards. Take 2 puffs twice a day during asthma flares for 1-2 weeks., Disp: 1 each, Rfl: 3   cetirizine  (ZYRTEC  ALLERGY ) 10 MG tablet, Take 1 tablet (10 mg total) by mouth at bedtime. (Patient not taking: Reported on 10/29/2022), Disp: 30 tablet, Rfl: 3   montelukast  (SINGULAIR ) 10 MG tablet, Take 1 tablet (10 mg total) by mouth at bedtime., Disp: 30 tablet, Rfl: 3   Allergies  Allergen Reactions   Other     Ragweed, trees, dust mites, cat, cockroaches    Past Medical History:  Diagnosis Date   Allergic rhinitis    environmental + cats   Moderate persistent asthma      Past Surgical History:  Procedure Laterality Date   broke finger Left    index    Family History  Problem Relation Age of Onset   Hypertension Mother    Alcohol abuse Mother    Depression  Mother    Hyperlipidemia Mother    Mental illness Mother    Miscarriages / [place] Mother    Alcohol abuse Maternal Grandmother     Social History   Tobacco Use   Smoking status: Never   Smokeless tobacco: Never  Vaping Use   Vaping status: Never Used  Substance Use Topics   Alcohol use: Yes    Comment: Sometimes   Drug use: Never    ROS   Objective:   Vitals: BP (!) 148/82 (BP Location: Right Arm)   Pulse 66   Temp 98.8 F (37.1 C) (Oral)   Resp 16   SpO2 97%   Physical Exam Constitutional:      General: He is not in acute distress.    Appearance: Normal appearance. He is well-developed and normal weight. He is not ill-appearing, toxic-appearing or diaphoretic.  HENT:     Head: Normocephalic and atraumatic.     Right Ear: External ear normal.     Left Ear: External ear normal.     Nose: Nose normal.     Mouth/Throat:     Pharynx: Oropharynx is clear.  Eyes:     General: No scleral icterus.       Right eye: No discharge.        Left eye: No discharge.     Extraocular Movements: Extraocular movements intact.  Cardiovascular:     Rate and Rhythm: Normal rate.  Pulmonary:     Effort: Pulmonary effort is normal.  Genitourinary:    Penis: Circumcised. No phimosis, paraphimosis, hypospadias, erythema, tenderness, discharge, swelling or lesions.   Musculoskeletal:     Cervical back: Normal range of motion.  Neurological:     Mental Status: He is alert and oriented to person, place, and time.  Psychiatric:        Mood and Affect: Mood normal.        Behavior: Behavior normal.        Thought Content: Thought content normal.        Judgment: Judgment normal.     Assessment and Plan :   PDMP not reviewed this encounter.  1. Penile pain    Physical exam findings unremarkable today.  Discussed possibility of balanitis and general management.  Tested with HSV culture, penile cytology.  Recommend hydrating consistently and avoiding urinary irritants.   Follow-up with urology.  Counseled patient on potential for adverse effects with medications prescribed/recommended today, ER and return-to-clinic precautions discussed, patient verbalized understanding.    Christopher Savannah, PA-C 12/27/23 1145

## 2023-12-27 NOTE — ED Triage Notes (Signed)
 Pt reports, penile pain x 1 1/2 month. States this happed before and Dr told him will better. Denies any discharge. Pain is worse when walking.

## 2023-12-28 LAB — HSV 1/2 PCR (SURFACE)
HSV-1 DNA: NOT DETECTED
HSV-2 DNA: NOT DETECTED

## 2023-12-29 LAB — CYTOLOGY, (ORAL, ANAL, URETHRAL) ANCILLARY ONLY
Chlamydia: NEGATIVE
Comment: NEGATIVE
Comment: NEGATIVE
Comment: NORMAL
Neisseria Gonorrhea: NEGATIVE
Trichomonas: NEGATIVE

## 2024-02-26 IMAGING — DX DG CHEST 2V
2 series · 2 of 2 positions shown · non-contrast
Comparison: 12/26/2020

CLINICAL DATA: Cough, acute URI

EXAM:
CHEST - 2 VIEW

[chest pa]
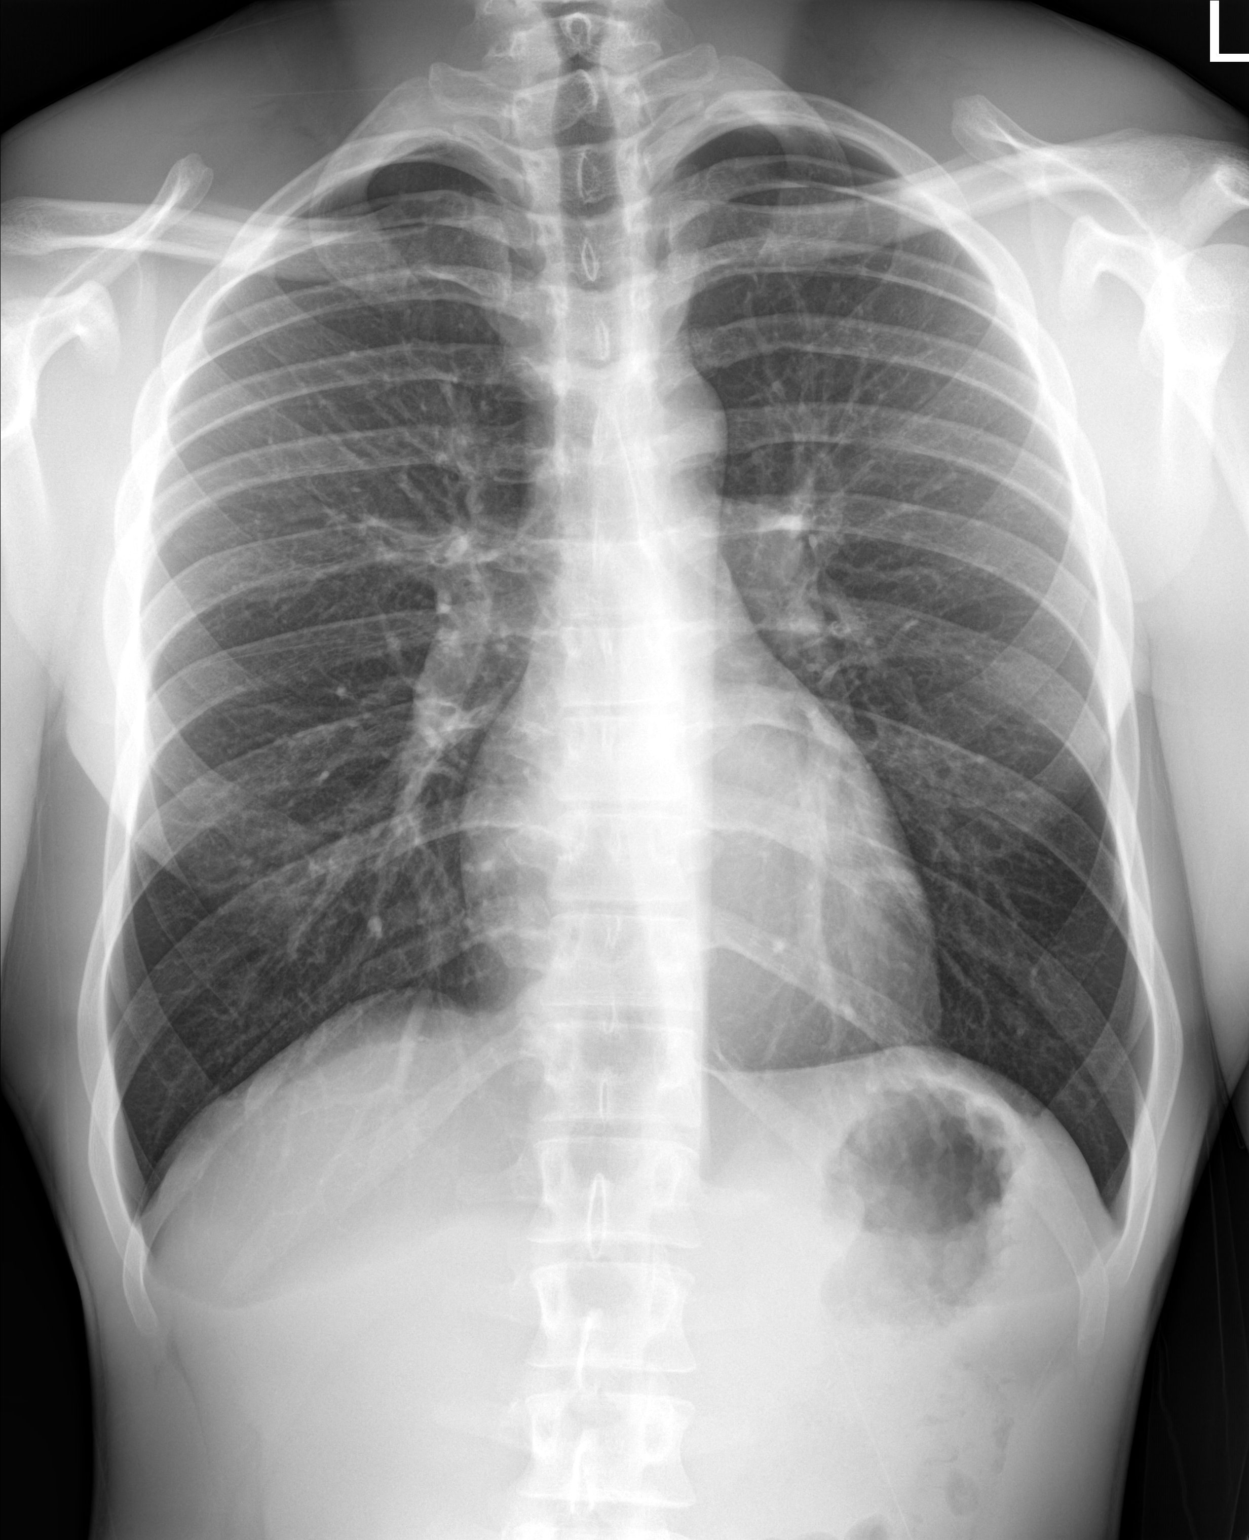

[chest lat]
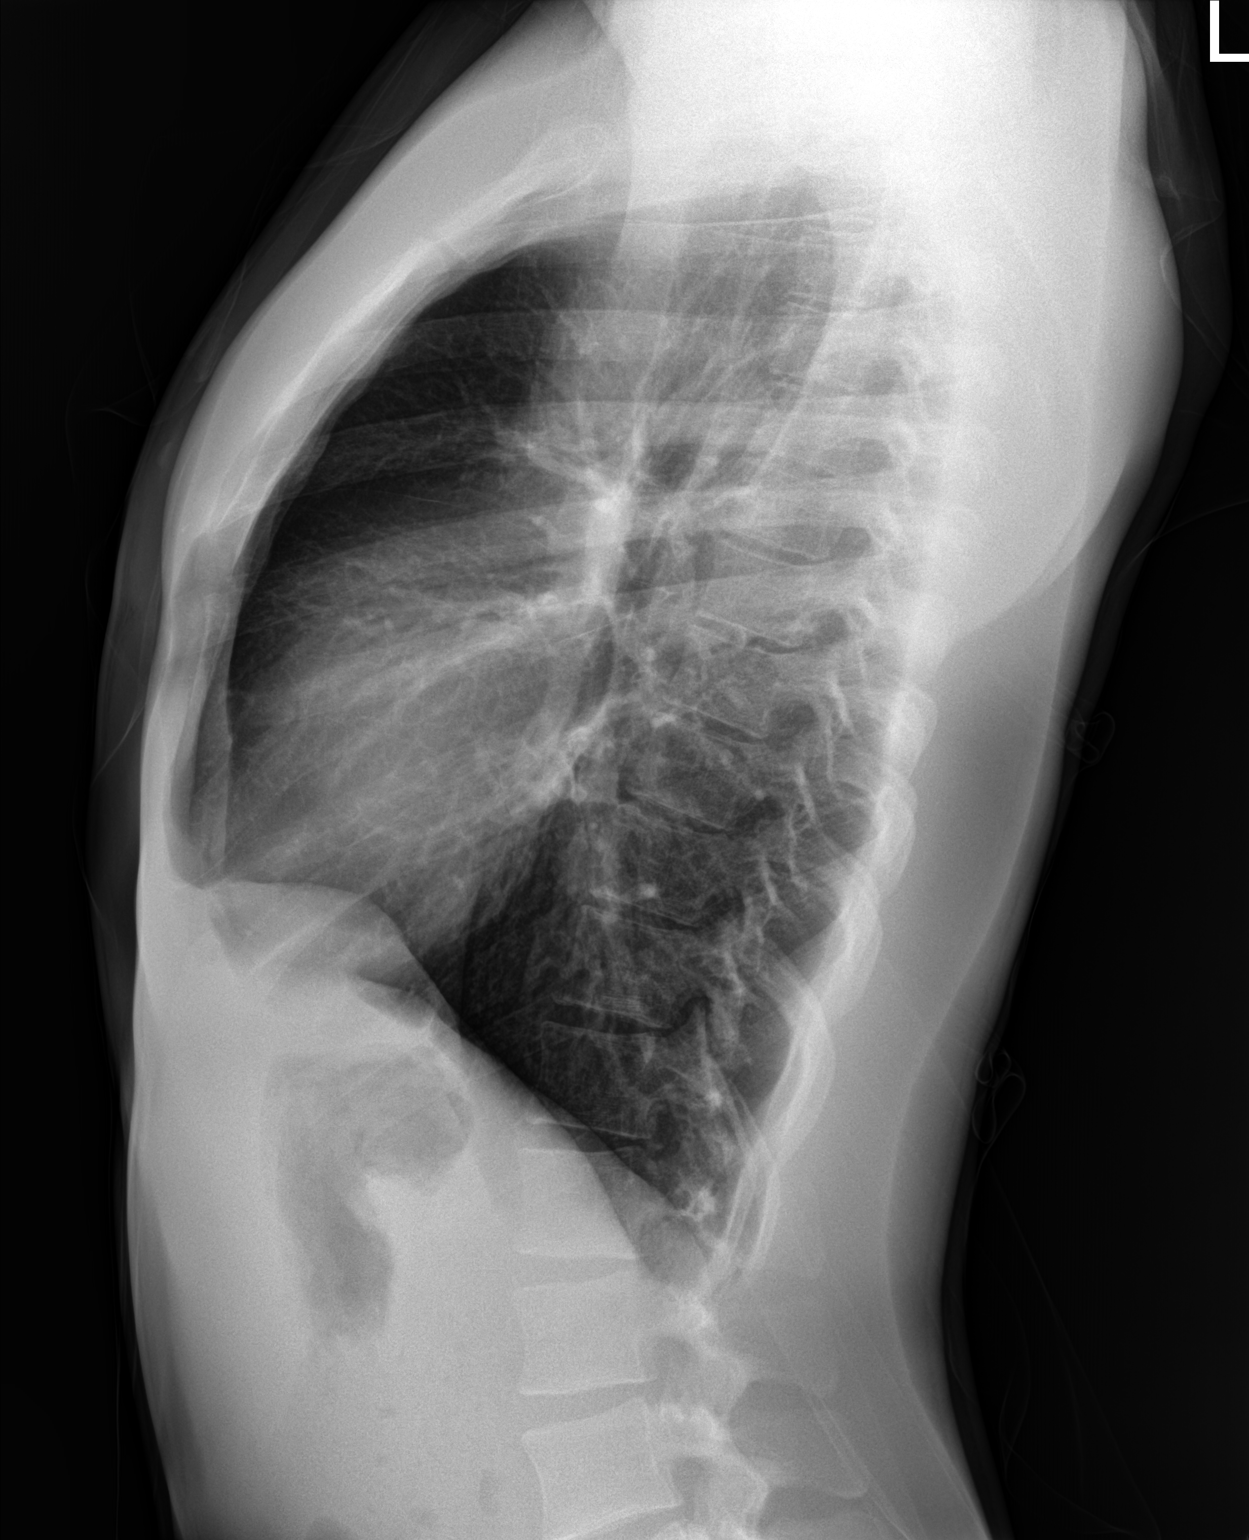

[2 of 2 positions shown; findings below may reference images not displayed]

FINDINGS: Normal heart size, mediastinal contours, and pulmonary vascularity.

Lungs hyperinflated but clear.

No pleural effusion or pneumothorax.

Bones unremarkable.
IMPRESSION: Hyperinflated lungs without acute infiltrate.

## 2024-04-27 ENCOUNTER — Ambulatory Visit: Payer: Self-pay | Admitting: Allergy

## 2024-04-27 DIAGNOSIS — J309 Allergic rhinitis, unspecified: Secondary | ICD-10-CM

## 2024-04-27 NOTE — Progress Notes (Deleted)
 Follow Up Note  RE: James Pittman MRN: [id number] DOB: 29-Mar-2001 Date of Office Visit: 04/27/2024  Referring provider: Candise Aleene DEL, MD Primary care provider: Candise Aleene DEL, MD  Chief Complaint: No chief complaint on file.  History of Present Illness: I had the pleasure of seeing James Pittman for a follow up visit at the [facility] on 04/27/2024. He is a 23 y.o. male, who is being followed for asthma, allergic rhinoconjunctivitis. His previous allergy  office visit was on 12/23/2023 with Dr. Luke. Today is a regular follow up visit.  Discussed the use of AI scribe software for clinical note transcription with the patient, who gave verbal consent to proceed.  History of Present Illness            ***  Assessment and Plan: James Pittman is a 23 y.o. male with: Not well controlled moderate persistent asthma Past history - diagnosed with asthma over 10 years ago and noted worsening symptom the past 5 years. Currently only using albuterol  on a daily basis. He is not sure if he had a daily maintenance inhaler in the past. Covid-19 in 2020. 2023 CXR unremarkable. 2024 spirometry showed: mixed obstructive and restrictive disease with 22% improvement in FEV1 post bronchodilator treatment. Clinically feeling slightly improved.  Interim history - not using Symbicort  daily and having to miss work as waking up with difficulty breathing. No ER/UC/systemic steroids since last visit. Needs FMLA forms filled out.  Today's spirometry showed mild obstruction. FMLA forms filled out. Stressed importance of daily preventative inhaler use to he does NOT have to miss work. Daily controller medication(s): restart Symbicort  160mcg 2 puffs ONCE a day with spacer and rinse mouth afterwards. Use the coupon and it should be affordable. If you can't afford it then let our office know ASAP. During respiratory infections/flares:  Increase Symbicort  160mg  2 puffs twice a day with spacer and  rinse mouth afterwards for 1-2 weeks until your breathing symptoms return to baseline.  May use albuterol  rescue inhaler 2 puffs every 4 to 6 hours as needed for shortness of breath, chest tightness, coughing, and wheezing. May use albuterol  rescue inhaler 2 puffs 5 to 15 minutes prior to strenuous physical activities. Monitor frequency of use - if you need to use it more than twice per week on a consistent basis let us  know.    Seasonal and perennial allergic rhinoconjunctivitis Past history - Cats flare rhino conjunctivitis symptoms and asthma. 2024 skin prick testing positive to ragweed, weed, trees, dust mites, cat and cockroach. Declined intradermal testing.  Interim history - only takes medications prn. Did not get bloodwork drawn.  Continue environmental control measures.  Take Zyrtec  (cetirizine ) 10mg  daily at night. May take twice a day during allergy  flares. May switch antihistamines every few months. Consider allergy  injections for long term control if above medications do not help the symptoms.  Assessment and Plan              No follow-ups on file.  No orders of the defined types were placed in this encounter.  Lab Orders  No laboratory test(s) ordered today    Diagnostics: Spirometry:  Tracings reviewed. His effort: {Blank single:19197::Good reproducible efforts.,It was hard to get consistent efforts and there is a question as to whether this reflects a maximal maneuver.,Poor effort, data can not be interpreted.} FVC: ***L FEV1: ***L, ***% predicted FEV1/FVC ratio: ***% Interpretation: {Blank single:19197::Spirometry consistent with mild obstructive disease,Spirometry consistent with moderate obstructive disease,Spirometry consistent with severe  obstructive disease,Spirometry consistent with possible restrictive disease,Spirometry consistent with mixed obstructive and restrictive disease,Spirometry uninterpretable due to technique,Spirometry  consistent with normal pattern,No overt abnormalities noted given today's efforts}.  Please see scanned spirometry results for details.  Skin Testing: {Blank single:19197::Select foods,Environmental allergy  panel,Environmental allergy  panel and select foods,Food allergy  panel,None,Deferred due to recent antihistamines use}. *** Results discussed with patient/family.   Medication List:  Current Outpatient Medications  Medication Sig Dispense Refill  . albuterol  (VENTOLIN  HFA) 108 (90 Base) MCG/ACT inhaler Inhale 2 puffs into the lungs every 4 (four) hours as needed for wheezing or shortness of breath (coughing fits). 18 g 1  . budesonide -formoterol  (SYMBICORT ) 160-4.5 MCG/ACT inhaler Inhale 2 puffs into the lungs daily. with spacer and rinse mouth afterwards. Take 2 puffs twice a day during asthma flares for 1-2 weeks. 1 each 3  . cetirizine  (ZYRTEC  ALLERGY ) 10 MG tablet Take 1 tablet (10 mg total) by mouth at bedtime. (Patient not taking: Reported on 10/29/2022) 30 tablet 3  . montelukast  (SINGULAIR ) 10 MG tablet Take 1 tablet (10 mg total) by mouth at bedtime. 30 tablet 3   No current facility-administered medications for this visit.   Allergies: Allergies  Allergen Reactions  . Other     Ragweed, trees, dust mites, cat, cockroaches   I reviewed his past medical history, social history, family history, and environmental history and no significant changes have been reported from his previous visit.  Review of Systems  Constitutional:  Negative for appetite change, chills, fever and unexpected weight change.  HENT:  Negative for congestion and rhinorrhea.   Eyes:  Negative for itching.  Respiratory:  Positive for chest tightness and shortness of breath. Negative for cough and wheezing.   Cardiovascular:  Negative for chest pain.  Gastrointestinal:  Negative for abdominal pain.  Genitourinary:  Negative for difficulty urinating.  Skin:  Negative for rash.   Allergic/Immunologic: Positive for environmental allergies.  Neurological:  Negative for headaches.    Objective: There were no vitals taken for this visit. There is no height or weight on file to calculate BMI. Physical Exam Vitals and nursing note reviewed.  Constitutional:      Appearance: Normal appearance. He is well-developed.  HENT:     Head: Normocephalic and atraumatic.     Right Ear: Tympanic membrane and external ear normal.     Left Ear: Tympanic membrane and external ear normal.     Nose: Nose normal.     Mouth/Throat:     Mouth: Mucous membranes are moist.     Pharynx: Oropharynx is clear.  Eyes:     Conjunctiva/sclera: Conjunctivae normal.  Cardiovascular:     Rate and Rhythm: Normal rate and regular rhythm.     Heart sounds: Normal heart sounds. No murmur heard. Pulmonary:     Effort: Pulmonary effort is normal.     Breath sounds: Normal breath sounds. No wheezing, rhonchi or rales.  Musculoskeletal:     Cervical back: Neck supple.  Skin:    General: Skin is warm.     Findings: No rash.  Neurological:     Mental Status: He is alert and oriented to person, place, and time.  Psychiatric:        Behavior: Behavior normal.   Previous notes and tests were reviewed. The plan was reviewed with the patient/family, and all questions/concerned were addressed.  It was my pleasure to see James Pittman today and participate in his care. Please feel free to contact me with any questions or concerns.  Sincerely,  Orlan Cramp, DO Allergy  & Immunology  [facility]  [place] office: [phone] [facility] office: [phone]
